# Patient Record
Sex: Male | Born: 1935 | Race: White | Hispanic: No | Smoking: Never smoker
Health system: Southern US, Community
[De-identification: ages and names within clinical notes are randomized; demographics above are authoritative.]

## PROBLEM LIST (undated history)

## (undated) DIAGNOSIS — R627 Adult failure to thrive: Secondary | ICD-10-CM

## (undated) DIAGNOSIS — J439 Emphysema, unspecified: Secondary | ICD-10-CM

## (undated) DIAGNOSIS — G4700 Insomnia, unspecified: Secondary | ICD-10-CM

## (undated) DIAGNOSIS — F0390 Unspecified dementia without behavioral disturbance: Secondary | ICD-10-CM

## (undated) DIAGNOSIS — R1310 Dysphagia, unspecified: Secondary | ICD-10-CM

## (undated) DIAGNOSIS — F329 Major depressive disorder, single episode, unspecified: Secondary | ICD-10-CM

## (undated) HISTORY — PX: OTHER SURGICAL HISTORY: SHX169

---

## 2014-01-04 ENCOUNTER — Emergency Department: Payer: Self-pay | Admitting: Internal Medicine

## 2014-01-04 LAB — URINALYSIS, COMPLETE
Bacteria: NONE SEEN
Bilirubin,UR: NEGATIVE
Blood: NEGATIVE
Glucose,UR: NEGATIVE mg/dL (ref 0–75)
KETONE: NEGATIVE
Leukocyte Esterase: NEGATIVE
NITRITE: NEGATIVE
PH: 6 (ref 4.5–8.0)
Protein: NEGATIVE
Specific Gravity: 1.017 (ref 1.003–1.030)
Squamous Epithelial: <1

## 2014-03-25 ENCOUNTER — Emergency Department: Payer: Self-pay | Admitting: Emergency Medicine

## 2014-10-05 ENCOUNTER — Emergency Department
Admission: EM | Admit: 2014-10-05 | Discharge: 2014-10-05 | Disposition: A | Discharge status: Home / Self Care | Payer: Medicare Other | Attending: Emergency Medicine | Admitting: Emergency Medicine

## 2014-10-05 ENCOUNTER — Emergency Department: Payer: Medicare Other

## 2014-10-05 ENCOUNTER — Encounter: Payer: Self-pay | Admitting: Emergency Medicine

## 2014-10-05 DIAGNOSIS — S0191XA Laceration without foreign body of unspecified part of head, initial encounter: Secondary | ICD-10-CM

## 2014-10-05 DIAGNOSIS — X58XXXA Exposure to other specified factors, initial encounter: Secondary | ICD-10-CM | POA: Diagnosis not present

## 2014-10-05 DIAGNOSIS — S0181XA Laceration without foreign body of other part of head, initial encounter: Secondary | ICD-10-CM | POA: Diagnosis not present

## 2014-10-05 DIAGNOSIS — F0390 Unspecified dementia without behavioral disturbance: Secondary | ICD-10-CM | POA: Insufficient documentation

## 2014-10-05 DIAGNOSIS — Y92129 Unspecified place in nursing home as the place of occurrence of the external cause: Secondary | ICD-10-CM | POA: Insufficient documentation

## 2014-10-05 DIAGNOSIS — Z79899 Other long term (current) drug therapy: Secondary | ICD-10-CM | POA: Insufficient documentation

## 2014-10-05 DIAGNOSIS — Y9389 Activity, other specified: Secondary | ICD-10-CM | POA: Insufficient documentation

## 2014-10-05 DIAGNOSIS — Y998 Other external cause status: Secondary | ICD-10-CM | POA: Diagnosis not present

## 2014-10-05 HISTORY — DX: Dysphagia, unspecified: R13.10

## 2014-10-05 HISTORY — DX: Unspecified dementia, unspecified severity, without behavioral disturbance, psychotic disturbance, mood disturbance, and anxiety: F03.90

## 2014-10-05 HISTORY — DX: Major depressive disorder, single episode, unspecified: F32.9

## 2014-10-05 HISTORY — DX: Adult failure to thrive: R62.7

## 2014-10-05 HISTORY — DX: Emphysema, unspecified: J43.9

## 2014-10-05 HISTORY — DX: Insomnia, unspecified: G47.00

## 2014-10-05 NOTE — Discharge Instructions (Signed)
The laceration Mr. Broome's headache is a bit complex. It required sutures on the front part (3 of them, 6-0 Prolene) and Steri-Strips on the back part due to the skin tear nature of the injury.  Mr. Steck's head CT was negative. He was alert and communicative without any focal neurologic defect.   The stitches can be removed in 7-10 days. The Steri-Strips be removed at that time also. He will need to see a doctor to further evaluate the skin tear portion, in case any skin needs to be debrided.  Follow-up with his regular doctor for this. Return to the emergency department if there are urgent concerns.  Sutured Wound Care Sutures are stitches that can be used to close wounds. Caring for your wound can help stop infection and lessen pain. HOME CARE   Rest and raise (elevate) the injured area until the pain and puffiness (swelling) go away.  Only take medicines as told by your doctor.  Clean the wound gently with mild soap and water once a day after the first 2 days. Rinse off the soap. Pat the area dry with a clean towel. Do not rub the wound.  Change the bandage (dressing) as told by your doctor. If the bandage sticks, soak it off with soapy water. Stop using a bandage after 2 days or after the wound stops leaking fluid.  Put cream on the wound as told by your doctor.  Do not stretch the wound.  Drink enough fluids to keep your pee (urine) clear or pale yellow.  See your doctor to have the sutures removed.  Use sunscreen or sunblock on the wound after it heals. GET HELP RIGHT AWAY IF:   Your wound gets red, puffy, hot, or tender.  You have more pain in the wound.  You have a red streak that goes away from the wound.  You see yellowish-white fluid (pus) coming out of the wound.  You have a fever.  You have chills and start to shake.  You notice a bad smell coming from the wound.  Your wound will not stop bleeding. MAKE SURE YOU:   Understand these  instructions.  Will watch your condition.  Will get help right away if you are not doing well or get worse. Document Released: 06/17/2007 Document Revised: 03/23/2011 Document Reviewed: 05/04/2010 [facility] Patient Information 2015 [place], [place]. This information is not intended to replace advice given to you by your health care provider. Make sure you discuss any questions you have with your health care provider.

## 2014-10-05 NOTE — ED Notes (Signed)
Verbal report given to EMS prior to transport back to [facility].

## 2014-10-05 NOTE — ED Notes (Signed)
Telephone report called to Jerrol Banana, LPN

## 2014-10-05 NOTE — ED Notes (Signed)
Pt arrives via EMS from [facility]. Pt was found by staff lying in bed with laceration to left side of forehead and blood in the floor. Pt with possible unwitnessed fall. Pt denies pain. Pt AAOx4. NAD noted. RR even and nonlabored. Laceration noted to left side of forehead. Per EMS, staff states pt usually walks and communicates.

## 2014-10-05 NOTE — ED Provider Notes (Signed)
[facility] Emergency Department Provider Note  ____________________________________________  Time seen: 7:09 AM  I have reviewed the triage vital signs and the nursing notes.   HISTORY  Chief Complaint Fall     HPI James Mcguire is a 81 y.o. male who presents to emergency department with a laceration to his upper left forehead., From wide-open manner. He is alert and communicative with me, though he does not recall the injury or the fall he likely had.  He denies any pain or injury elsewhere. He is cooperative with exam although a little annoyed by it. He denies weakness, chest pain, shortness of breath, or headache.     Past Medical History  Diagnosis Date  . Dementia   . Dysphagia   . Insomnia   . Failure to thrive in adult   . Emphysema of lung   . Major depressive disorder   . Dementia     There are no active problems to display for this patient.   History reviewed. No pertinent past surgical history.  Current Outpatient Rx  Name  Route  Sig  Dispense  Refill  . acetaminophen (TYLENOL) 500 MG tablet   Oral   Take 500 mg by mouth every 4 (four) hours as needed for mild pain, moderate pain or fever.         Marland Kitchen albuterol-ipratropium (COMBIVENT) 18-103 MCG/ACT inhaler   Inhalation   Inhale 1 puff into the lungs 3 (three) times daily.         . clonazepam (KLONOPIN) 0.125 MG disintegrating tablet   Oral   Take 0.125 mg by mouth 2 (two) times daily.         . clonazepam (KLONOPIN) 0.125 MG disintegrating tablet   Oral   Take 0.125 mg by mouth daily as needed (anxiety).         Marland Kitchen ipratropium-albuterol (DUONEB) 0.5-2.5 (3) MG/3ML SOLN   Nebulization   Take 3 mLs by nebulization 4 (four) times daily as needed (Shortness of Breath/ Wheezing).         . Menthol-Zinc Oxide (CALMOSEPTINE) 0.44-20.6 % OINT   Apply externally   Apply 1 application topically as needed (moisture barrier). Apply with Vitamin A&D ointment          . mirtazapine (REMERON) 15 MG tablet   Oral   Take 7.5 mg by mouth at bedtime.         . polyethylene glycol (MIRALAX / GLYCOLAX) packet   Oral   Take 17 g by mouth daily.         Marland Kitchen senna-docusate (SENOKOT-S) 8.6-50 MG per tablet   Oral   Take 2 tablets by mouth 2 (two) times daily.         . traZODone (DESYREL) 50 MG tablet   Oral   Take 50 mg by mouth at bedtime as needed for sleep.         . Vitamins A & D (VITAMIN A & D) ointment   Topical   Apply 1 application topically as needed (moisture barrier). Apply with Calmoseptine           Allergies Review of patient's allergies indicates no known allergies.  No family history on file.  Social History Social History  Substance Use Topics  . Smoking status: Never Smoker   . Smokeless tobacco: None  . Alcohol Use: No    Review of Systems  Constitutional: Negative for fever. ENT: Negative for sore throat. Cardiovascular: Negative for chest pain. Respiratory: Negative for  shortness of breath. Gastrointestinal: Negative for abdominal pain, vomiting and diarrhea. Genitourinary: Negative for dysuria. Musculoskeletal: No myalgias or injuries. Skin: Notable for skin laceration, left upper forehead.   10-point ROS otherwise negative.  ____________________________________________   PHYSICAL EXAM:  VITAL SIGNS: ED Triage Vitals  Enc Vitals Group     BP 10/05/14 0635 149/82 mmHg     Pulse Rate 10/05/14 0635 68     Resp 10/05/14 0635 18     Temp 10/05/14 0635 97.6 F (36.4 C)     Temp Source 10/05/14 0635 Oral     SpO2 10/05/14 0635 96 %     Weight 10/05/14 0635 130 lb (58.968 kg)     Height 10/05/14 0635  (1.753 m)     Head Cir --      Peak Flow --      Pain Score 10/05/14 0636 0     Pain Loc --      Pain Edu? --      Excl. in GC? --     Constitutional: Alert communicative. No distress. ENT   Head: Laceration to left upper forehead area and this laceration appears rather irregular and  is approximately 3 cm long.   Nose: No congestion/rhinnorhea.   Mouth/Throat: Mucous membranes are moist. Cardiovascular: Normal rate, regular rhythm, no murmur noted Respiratory:  Normal respiratory effort, no tachypnea.    Breath sounds are clear and equal bilaterally.  Gastrointestinal: Soft and nontender. No distention.  Back: No muscle spasm, no tenderness, no CVA tenderness. Musculoskeletal: No deformity noted. Nontender with normal range of motion in all extremities.  No noted edema. Pelvis is stable, hips are nontender. Neurologic: Alert, communicative. Moves all 4 extremities without difficulty. Appears appropriate.. No gross focal neurologic deficits are appreciated.  Skin:  Laceration to left upper forehead. This laceration is somewhat irregular with tissue being a little bit confused. The anterior portion is a straightforward laceration, but involves into a skin tear further back along the track. It's approximately 4 cm long.  ____________________________________________    LABS (pertinent positives/negatives)    ____________________________________________   EKG  ED ECG REPORT I, Arianni Gallego W, the attending physician, personally viewed and interpreted this ECG.   Date: 10/05/2014  EKG Time: 6:33 AM   Rate: 71  Rhythm: Normal sinus rhythm  Axis: Normal  Intervals: Normal  ST&T Change: None noted   ____________________________________________    RADIOLOGY  CT head  IMPRESSION: 1. No acute intracranial abnormalities. 2. Mild atrophy and chronic microvascular ischemic change.   ____________________________________________   PROCEDURES  LACERATION REPAIR Performed by: Darien Ramus Authorized by: Darien Ramus Consent: Verbal consent obtained. Risks and benefits: risks, benefits and alternatives were discussed Consent given by: patient Patient identity confirmed: provided demographic data Prepped and Draped in normal sterile  fashion Wound explored  Laceration Location: Left upper for head  Laceration Length: 4 cm  No Foreign Bodies seen or palpated  Irrigation method: syringe Amount of cleaning: standard  Skin closure: Complex closure involving both 6-0 Prolene suture and Steri-Strips   Number of sutures: 3 stitches, simple, on the anterior portion and 3 Steri-Strips on the posterior skin tear section   Technique: Combination of simple sutures and Steri-Strip placement due to the laceration, skin tear, combination.   Patient tolerance: Patient tolerated the procedure well with no immediate complications.  ____________________________________________   INITIAL IMPRESSION / ASSESSMENT AND PLAN / ED COURSE  Pertinent labs & imaging results that were available during my care of the patient were  reviewed by me and considered in my medical decision making (see chart for details).  Pleasant alert 81 year old male in no acute distress. He denies any pain or injury. He is aware of the cut on his head. He does not recall how this occurred.  I have closed this somewhat complicated laceration/skin tear. While he is neurologically intact and without complaint, given his advanced age, lack of memory for the event, and injury to the head, we will perform a CT scan on his head.  ____________________________________________   FINAL CLINICAL IMPRESSION(S) / ED DIAGNOSES  Final diagnoses:  Laceration of head, initial encounter  head contusion    Darien Ramus, MD 10/05/14 [phone]

## 2014-10-05 NOTE — ED Notes (Signed)
Vanita Panda (son-in-law) called and informed of visit to ER and updated on laceration to head and results from CT scan.  Vanita Panda asked to come and pick patient up and take back to [facility] as patient is being discharged.  Vanita Panda refused and asked that he be taken back by EMS.  Dr. Carollee Massed informed.

## 2014-10-07 MED ORDER — GI COCKTAIL ~~LOC~~
ORAL | Status: AC
  Filled 2014-10-07: qty 30

## 2014-10-10 MED ORDER — LEVOFLOXACIN 750 MG PO TABS
ORAL_TABLET | ORAL | Status: AC
  Filled 2014-10-10: qty 1

## 2014-10-11 ENCOUNTER — Other Ambulatory Visit
Admission: RE | Admit: 2014-10-11 | Discharge: 2014-10-11 | Disposition: A | Discharge status: Home / Self Care | Payer: Medicare Other | Source: Other Acute Inpatient Hospital | Attending: Family Medicine | Admitting: Family Medicine

## 2014-10-11 ENCOUNTER — Other Ambulatory Visit: Admission: RE | Admit: 2014-10-11 | Payer: Medicare Other

## 2014-10-11 DIAGNOSIS — R509 Fever, unspecified: Secondary | ICD-10-CM | POA: Diagnosis present

## 2014-10-11 DIAGNOSIS — R0981 Nasal congestion: Secondary | ICD-10-CM | POA: Diagnosis present

## 2014-10-11 LAB — CBC WITH DIFFERENTIAL/PLATELET
Basophils Absolute: 0.1 10*3/uL (ref 0–0.1)
Basophils Relative: 1 %
EOS PCT: 7 %
Eosinophils Absolute: 0.8 10*3/uL — ABNORMAL HIGH (ref 0–0.7)
HEMATOCRIT: 33.2 % — AB (ref 40.0–52.0)
HEMOGLOBIN: 11.2 g/dL — AB (ref 13.0–18.0)
LYMPHS ABS: 1.5 10*3/uL (ref 1.0–3.6)
LYMPHS PCT: 13 %
MCH: 31.6 pg (ref 26.0–34.0)
MCHC: 33.6 g/dL (ref 32.0–36.0)
MCV: 94.1 fL (ref 80.0–100.0)
Monocytes Absolute: 1 10*3/uL (ref 0.2–1.0)
Monocytes Relative: 9 %
NEUTROS ABS: 7.9 10*3/uL — AB (ref 1.4–6.5)
NEUTROS PCT: 70 %
Platelets: 363 10*3/uL (ref 150–440)
RBC: 3.53 MIL/uL — AB (ref 4.40–5.90)
RDW: 13.5 % (ref 11.5–14.5)
WBC: 11.2 10*3/uL — AB (ref 3.8–10.6)

## 2014-10-11 LAB — ELECTROLYTE PANEL
ANION GAP: 7 (ref 5–15)
CO2: 27 mmol/L (ref 22–32)
Chloride: 102 mmol/L (ref 101–111)
Potassium: 3.9 mmol/L (ref 3.5–5.1)
Sodium: 136 mmol/L (ref 135–145)

## 2015-02-27 ENCOUNTER — Inpatient Hospital Stay: Payer: Medicare Other

## 2015-02-27 ENCOUNTER — Inpatient Hospital Stay: Payer: Medicare Other | Admitting: Anesthesiology

## 2015-02-27 ENCOUNTER — Emergency Department: Payer: Medicare Other

## 2015-02-27 ENCOUNTER — Encounter
Admission: EM | Disposition: A | Discharge status: Skilled Nursing Facility | Payer: Self-pay | Source: Home / Self Care | Attending: Internal Medicine

## 2015-02-27 ENCOUNTER — Inpatient Hospital Stay
Admission: EM | Admit: 2015-02-27 | Discharge: 2015-03-03 | DRG: 469 | Disposition: A | Discharge status: Skilled Nursing Facility | Payer: Medicare Other | Attending: Internal Medicine | Admitting: Internal Medicine

## 2015-02-27 DIAGNOSIS — E43 Unspecified severe protein-calorie malnutrition: Secondary | ICD-10-CM | POA: Diagnosis present

## 2015-02-27 DIAGNOSIS — F329 Major depressive disorder, single episode, unspecified: Secondary | ICD-10-CM | POA: Diagnosis present

## 2015-02-27 DIAGNOSIS — N390 Urinary tract infection, site not specified: Secondary | ICD-10-CM | POA: Diagnosis not present

## 2015-02-27 DIAGNOSIS — R509 Fever, unspecified: Secondary | ICD-10-CM

## 2015-02-27 DIAGNOSIS — Z9289 Personal history of other medical treatment: Secondary | ICD-10-CM

## 2015-02-27 DIAGNOSIS — M4856XA Collapsed vertebra, not elsewhere classified, lumbar region, initial encounter for fracture: Secondary | ICD-10-CM | POA: Diagnosis present

## 2015-02-27 DIAGNOSIS — R627 Adult failure to thrive: Secondary | ICD-10-CM

## 2015-02-27 DIAGNOSIS — I959 Hypotension, unspecified: Secondary | ICD-10-CM

## 2015-02-27 DIAGNOSIS — S72002A Fracture of unspecified part of neck of left femur, initial encounter for closed fracture: Principal | ICD-10-CM | POA: Diagnosis present

## 2015-02-27 DIAGNOSIS — J449 Chronic obstructive pulmonary disease, unspecified: Secondary | ICD-10-CM | POA: Diagnosis present

## 2015-02-27 DIAGNOSIS — Z419 Encounter for procedure for purposes other than remedying health state, unspecified: Secondary | ICD-10-CM

## 2015-02-27 DIAGNOSIS — D62 Acute posthemorrhagic anemia: Secondary | ICD-10-CM | POA: Diagnosis not present

## 2015-02-27 DIAGNOSIS — E559 Vitamin D deficiency, unspecified: Secondary | ICD-10-CM | POA: Diagnosis present

## 2015-02-27 DIAGNOSIS — R1310 Dysphagia, unspecified: Secondary | ICD-10-CM | POA: Diagnosis present

## 2015-02-27 DIAGNOSIS — Y92129 Unspecified place in nursing home as the place of occurrence of the external cause: Secondary | ICD-10-CM

## 2015-02-27 DIAGNOSIS — Z87891 Personal history of nicotine dependence: Secondary | ICD-10-CM | POA: Diagnosis not present

## 2015-02-27 DIAGNOSIS — Z79899 Other long term (current) drug therapy: Secondary | ICD-10-CM | POA: Diagnosis not present

## 2015-02-27 DIAGNOSIS — Z6821 Body mass index (BMI) 21.0-21.9, adult: Secondary | ICD-10-CM

## 2015-02-27 DIAGNOSIS — T148 Other injury of unspecified body region: Secondary | ICD-10-CM | POA: Diagnosis present

## 2015-02-27 DIAGNOSIS — D638 Anemia in other chronic diseases classified elsewhere: Secondary | ICD-10-CM | POA: Diagnosis present

## 2015-02-27 DIAGNOSIS — IMO0002 Reserved for concepts with insufficient information to code with codable children: Secondary | ICD-10-CM

## 2015-02-27 DIAGNOSIS — F0390 Unspecified dementia without behavioral disturbance: Secondary | ICD-10-CM | POA: Diagnosis present

## 2015-02-27 DIAGNOSIS — Z66 Do not resuscitate: Secondary | ICD-10-CM | POA: Diagnosis present

## 2015-02-27 DIAGNOSIS — W1839XA Other fall on same level, initial encounter: Secondary | ICD-10-CM | POA: Diagnosis present

## 2015-02-27 DIAGNOSIS — G8918 Other acute postprocedural pain: Secondary | ICD-10-CM

## 2015-02-27 DIAGNOSIS — E860 Dehydration: Secondary | ICD-10-CM | POA: Diagnosis present

## 2015-02-27 DIAGNOSIS — S32010A Wedge compression fracture of first lumbar vertebra, initial encounter for closed fracture: Secondary | ICD-10-CM

## 2015-02-27 HISTORY — PX: TOTAL HIP ARTHROPLASTY: SHX124

## 2015-02-27 LAB — COMPREHENSIVE METABOLIC PANEL
ALBUMIN: 4 g/dL (ref 3.5–5.0)
ALT: 17 U/L (ref 17–63)
ANION GAP: 8 (ref 5–15)
AST: 25 U/L (ref 15–41)
Alkaline Phosphatase: 60 U/L (ref 38–126)
BUN: 34 mg/dL — ABNORMAL HIGH (ref 6–20)
CHLORIDE: 104 mmol/L (ref 101–111)
CO2: 23 mmol/L (ref 22–32)
Calcium: 8.7 mg/dL — ABNORMAL LOW (ref 8.9–10.3)
Creatinine, Ser: 1.09 mg/dL (ref 0.61–1.24)
GFR calc non Af Amer: >60 mL/min (ref >60)
Glucose, Bld: 110 mg/dL — ABNORMAL HIGH (ref 65–99)
Potassium: 3.8 mmol/L (ref 3.5–5.1)
SODIUM: 135 mmol/L (ref 135–145)
Total Bilirubin: 0.7 mg/dL (ref 0.3–1.2)
Total Protein: 6.8 g/dL (ref 6.5–8.1)

## 2015-02-27 LAB — URINALYSIS COMPLETE WITH MICROSCOPIC (ARMC ONLY)
Bacteria, UA: NONE SEEN
Bilirubin Urine: NEGATIVE
Glucose, UA: NEGATIVE mg/dL
Hgb urine dipstick: NEGATIVE
Leukocytes, UA: NEGATIVE
Nitrite: NEGATIVE
Protein, ur: NEGATIVE mg/dL
Specific Gravity, Urine: 1.018 (ref 1.005–1.030)
pH: 6 (ref 5.0–8.0)

## 2015-02-27 LAB — CBC
HCT: 29.3 % — ABNORMAL LOW (ref 40.0–52.0)
Hemoglobin: 9.7 g/dL — ABNORMAL LOW (ref 13.0–18.0)
MCH: 30.6 pg (ref 26.0–34.0)
MCHC: 33.1 g/dL (ref 32.0–36.0)
MCV: 92.5 fL (ref 80.0–100.0)
PLATELETS: 308 10*3/uL (ref 150–440)
RBC: 3.17 MIL/uL — AB (ref 4.40–5.90)
RDW: 13.3 % (ref 11.5–14.5)
WBC: 12.7 10*3/uL — AB (ref 3.8–10.6)

## 2015-02-27 LAB — ABO/RH: ABO/RH(D): A POS

## 2015-02-27 LAB — HEMOGLOBIN A1C: Hgb A1c MFr Bld: 5.6 % (ref 4.0–6.0)

## 2015-02-27 LAB — MRSA PCR SCREENING: MRSA by PCR: NEGATIVE

## 2015-02-27 LAB — TROPONIN I: Troponin I: 0.04 ng/mL — ABNORMAL HIGH (ref <0.031)

## 2015-02-27 LAB — TSH: TSH: 3.91 u[IU]/mL (ref 0.350–4.500)

## 2015-02-27 SURGERY — ARTHROPLASTY, HIP, TOTAL, ANTERIOR APPROACH
Anesthesia: General | Site: Hip | Laterality: Left | Wound class: Clean

## 2015-02-27 MED ORDER — TIOTROPIUM BROMIDE MONOHYDRATE 18 MCG IN CAPS
18.0000 ug | ORAL_CAPSULE | Freq: Every day | RESPIRATORY_TRACT | Status: DC
  Administered 2015-03-01 – 2015-03-03 (×3): 18 ug via RESPIRATORY_TRACT
  Filled 2015-02-27: qty 5

## 2015-02-27 MED ORDER — VITAMINS A & D EX OINT: 1.0000 "application " | TOPICAL_OINTMENT | CUTANEOUS | Status: DC | PRN

## 2015-02-27 MED ORDER — ENOXAPARIN SODIUM 40 MG/0.4ML ~~LOC~~ SOLN
40.0000 mg | SUBCUTANEOUS | Status: DC
  Administered 2015-02-28 – 2015-03-03 (×4): 40 mg via SUBCUTANEOUS
  Filled 2015-02-27 (×4): qty 0.4

## 2015-02-27 MED ORDER — IPRATROPIUM-ALBUTEROL 0.5-2.5 (3) MG/3ML IN SOLN
3.0000 mL | Freq: Four times a day (QID) | RESPIRATORY_TRACT | Status: DC | PRN
  Filled 2015-02-27: qty 3

## 2015-02-27 MED ORDER — TIOTROPIUM BROMIDE MONOHYDRATE 18 MCG IN CAPS
18.0000 ug | ORAL_CAPSULE | Freq: Every day | RESPIRATORY_TRACT | Status: DC
  Administered 2015-02-28: 18 ug via RESPIRATORY_TRACT
  Filled 2015-02-27: qty 5

## 2015-02-27 MED ORDER — PROPOFOL 10 MG/ML IV BOLUS
INTRAVENOUS | Status: DC | PRN
  Administered 2015-02-27: 80 mg via INTRAVENOUS

## 2015-02-27 MED ORDER — ACETAMINOPHEN 500 MG PO TABS
1000.0000 mg | ORAL_TABLET | Freq: Four times a day (QID) | ORAL | Status: AC
  Administered 2015-02-27 – 2015-02-28 (×2): 1000 mg via ORAL
  Filled 2015-02-27: qty 2

## 2015-02-27 MED ORDER — DIPHENHYDRAMINE HCL 12.5 MG/5ML PO ELIX: 12.5000 mg | ORAL_SOLUTION | ORAL | Status: DC | PRN

## 2015-02-27 MED ORDER — ONDANSETRON HCL 4 MG/2ML IJ SOLN
INTRAMUSCULAR | Status: AC
  Administered 2015-02-27: 4 mg via INTRAVENOUS
  Filled 2015-02-27: qty 2

## 2015-02-27 MED ORDER — CLONAZEPAM 0.125 MG PO TBDP: 0.1250 mg | ORAL_TABLET | Freq: Every day | ORAL | Status: DC | PRN

## 2015-02-27 MED ORDER — CEFAZOLIN SODIUM 1-5 GM-% IV SOLN
1.0000 g | Freq: Four times a day (QID) | INTRAVENOUS | Status: AC
  Administered 2015-02-27 – 2015-02-28 (×3): 1 g via INTRAVENOUS
  Filled 2015-02-27 (×3): qty 50

## 2015-02-27 MED ORDER — ONDANSETRON HCL 4 MG/2ML IJ SOLN: 4.0000 mg | Freq: Once | INTRAMUSCULAR | Status: DC | PRN

## 2015-02-27 MED ORDER — BUPIVACAINE-EPINEPHRINE 0.25% -1:200000 IJ SOLN
INTRAMUSCULAR | Status: DC | PRN
  Administered 2015-02-27: 30 mL

## 2015-02-27 MED ORDER — FENTANYL CITRATE (PF) 100 MCG/2ML IJ SOLN: 25.0000 ug | INTRAMUSCULAR | Status: DC | PRN

## 2015-02-27 MED ORDER — NEOMYCIN-POLYMYXIN B GU 40-200000 IR SOLN
Status: DC | PRN
  Administered 2015-02-27: 4 mL

## 2015-02-27 MED ORDER — IPRATROPIUM-ALBUTEROL 18-103 MCG/ACT IN AERO: 1.0000 | INHALATION_SPRAY | Freq: Three times a day (TID) | RESPIRATORY_TRACT | Status: DC

## 2015-02-27 MED ORDER — MIRTAZAPINE 15 MG PO TABS
7.5000 mg | ORAL_TABLET | Freq: Every day | ORAL | Status: DC
  Administered 2015-02-28 – 2015-03-02 (×3): 7.5 mg via ORAL
  Filled 2015-02-27: qty 2
  Filled 2015-02-27 (×2): qty 1

## 2015-02-27 MED ORDER — OXYCODONE HCL 5 MG PO TABS
5.0000 mg | ORAL_TABLET | ORAL | Status: DC | PRN
  Administered 2015-02-27 – 2015-03-03 (×4): 5 mg via ORAL
  Filled 2015-02-27: qty 1
  Filled 2015-02-27: qty 2
  Filled 2015-02-27 (×2): qty 1

## 2015-02-27 MED ORDER — ACETAMINOPHEN 325 MG PO TABS
650.0000 mg | ORAL_TABLET | Freq: Four times a day (QID) | ORAL | Status: DC | PRN
  Administered 2015-02-27 – 2015-03-02 (×4): 650 mg via ORAL
  Filled 2015-02-27 (×6): qty 2

## 2015-02-27 MED ORDER — ACETAMINOPHEN 10 MG/ML IV SOLN
INTRAVENOUS | Status: DC | PRN
  Administered 2015-02-27: 1000 mg via INTRAVENOUS

## 2015-02-27 MED ORDER — MORPHINE SULFATE (PF) 2 MG/ML IV SOLN
2.0000 mg | Freq: Once | INTRAVENOUS | Status: AC
  Administered 2015-02-27: 2 mg via INTRAVENOUS

## 2015-02-27 MED ORDER — IPRATROPIUM-ALBUTEROL 0.5-2.5 (3) MG/3ML IN SOLN
3.0000 mL | Freq: Four times a day (QID) | RESPIRATORY_TRACT | Status: DC
  Administered 2015-02-27 (×2): 3 mL via RESPIRATORY_TRACT
  Filled 2015-02-27 (×2): qty 3

## 2015-02-27 MED ORDER — LIDOCAINE HCL (CARDIAC) 20 MG/ML IV SOLN
INTRAVENOUS | Status: DC | PRN
  Administered 2015-02-27: 100 mg via INTRAVENOUS

## 2015-02-27 MED ORDER — ONDANSETRON HCL 4 MG/2ML IJ SOLN: 4.0000 mg | Freq: Four times a day (QID) | INTRAMUSCULAR | Status: DC | PRN

## 2015-02-27 MED ORDER — ACETAMINOPHEN 650 MG RE SUPP: 650.0000 mg | Freq: Four times a day (QID) | RECTAL | Status: DC | PRN

## 2015-02-27 MED ORDER — TRAZODONE HCL 50 MG PO TABS
50.0000 mg | ORAL_TABLET | Freq: Every evening | ORAL | Status: DC | PRN
  Administered 2015-02-27 – 2015-03-02 (×4): 50 mg via ORAL
  Filled 2015-02-27 (×4): qty 1

## 2015-02-27 MED ORDER — ONDANSETRON HCL 4 MG/2ML IJ SOLN
4.0000 mg | Freq: Once | INTRAMUSCULAR | Status: AC
  Administered 2015-02-27: 4 mg via INTRAVENOUS

## 2015-02-27 MED ORDER — MENTHOL 3 MG MT LOZG: 1.0000 | LOZENGE | OROMUCOSAL | Status: DC | PRN

## 2015-02-27 MED ORDER — SENNOSIDES-DOCUSATE SODIUM 8.6-50 MG PO TABS
2.0000 | ORAL_TABLET | Freq: Two times a day (BID) | ORAL | Status: DC
  Administered 2015-02-28 – 2015-03-03 (×6): 2 via ORAL
  Filled 2015-02-27 (×7): qty 2

## 2015-02-27 MED ORDER — PHENOL 1.4 % MT LIQD: 1.0000 | OROMUCOSAL | Status: DC | PRN

## 2015-02-27 MED ORDER — SODIUM CHLORIDE 0.9 % IV SOLN
INTRAVENOUS | Status: DC
  Administered 2015-02-27 – 2015-03-02 (×4): via INTRAVENOUS

## 2015-02-27 MED ORDER — FENTANYL CITRATE (PF) 100 MCG/2ML IJ SOLN
INTRAMUSCULAR | Status: DC | PRN
  Administered 2015-02-27: 100 ug via INTRAVENOUS
  Administered 2015-02-27: 50 ug via INTRAVENOUS

## 2015-02-27 MED ORDER — SODIUM CHLORIDE 0.9 % IV SOLN
INTRAVENOUS | Status: DC
  Administered 2015-02-27 – 2015-03-01 (×3): via INTRAVENOUS

## 2015-02-27 MED ORDER — MAGNESIUM HYDROXIDE 400 MG/5ML PO SUSP: 30.0000 mL | Freq: Every day | ORAL | Status: DC | PRN

## 2015-02-27 MED ORDER — CEFAZOLIN SODIUM 1-5 GM-% IV SOLN
1.0000 g | Freq: Once | INTRAVENOUS | Status: DC
  Filled 2015-02-27 (×2): qty 50

## 2015-02-27 MED ORDER — LACTATED RINGERS IV SOLN
INTRAVENOUS | Status: DC | PRN
  Administered 2015-02-27: 17:00:00 via INTRAVENOUS

## 2015-02-27 MED ORDER — SODIUM CHLORIDE 0.9% FLUSH
3.0000 mL | Freq: Two times a day (BID) | INTRAVENOUS | Status: DC
  Administered 2015-02-28 – 2015-03-02 (×4): 3 mL via INTRAVENOUS

## 2015-02-27 MED ORDER — POLYETHYLENE GLYCOL 3350 17 G PO PACK
17.0000 g | PACK | Freq: Every day | ORAL | Status: DC
  Administered 2015-02-28 – 2015-03-03 (×3): 17 g via ORAL
  Filled 2015-02-27 (×4): qty 1

## 2015-02-27 MED ORDER — MORPHINE SULFATE (PF) 2 MG/ML IV SOLN
INTRAVENOUS | Status: AC
  Administered 2015-02-27: 2 mg via INTRAVENOUS
  Filled 2015-02-27: qty 1

## 2015-02-27 MED ORDER — ZINC OXIDE 20 % EX OINT: 1.0000 "application " | TOPICAL_OINTMENT | CUTANEOUS | Status: DC | PRN

## 2015-02-27 MED ORDER — SODIUM CHLORIDE 0.9 % IV SOLN
INTRAVENOUS | Status: DC
  Administered 2015-02-27: 05:00:00 via INTRAVENOUS

## 2015-02-27 MED ORDER — SUCCINYLCHOLINE CHLORIDE 20 MG/ML IJ SOLN
INTRAMUSCULAR | Status: DC | PRN
  Administered 2015-02-27: 50 mg via INTRAVENOUS
  Administered 2015-02-27: 100 mg via INTRAVENOUS

## 2015-02-27 MED ORDER — ONDANSETRON HCL 4 MG PO TABS: 4.0000 mg | ORAL_TABLET | Freq: Four times a day (QID) | ORAL | Status: DC | PRN

## 2015-02-27 MED ORDER — BISACODYL 10 MG RE SUPP
10.0000 mg | Freq: Every day | RECTAL | Status: DC | PRN
  Administered 2015-03-01: 10 mg via RECTAL
  Filled 2015-02-27: qty 1

## 2015-02-27 MED ORDER — CLONAZEPAM 0.125 MG PO TBDP
0.1250 mg | ORAL_TABLET | Freq: Two times a day (BID) | ORAL | Status: DC
  Administered 2015-02-28 – 2015-03-03 (×6): 0.125 mg via ORAL
  Filled 2015-02-27 (×6): qty 1

## 2015-02-27 MED ORDER — MORPHINE SULFATE (PF) 2 MG/ML IV SOLN
2.0000 mg | INTRAVENOUS | Status: DC | PRN
  Administered 2015-02-27: 2 mg via INTRAVENOUS
  Filled 2015-02-27: qty 1

## 2015-02-27 MED ORDER — SUGAMMADEX SODIUM 200 MG/2ML IV SOLN
INTRAVENOUS | Status: DC | PRN
  Administered 2015-02-27: 121.2 mg via INTRAVENOUS

## 2015-02-27 MED ORDER — MAGNESIUM CITRATE PO SOLN: 1.0000 | Freq: Once | ORAL | Status: DC | PRN

## 2015-02-27 MED ORDER — ALUM & MAG HYDROXIDE-SIMETH 200-200-20 MG/5ML PO SUSP: 30.0000 mL | ORAL | Status: DC | PRN

## 2015-02-27 MED ORDER — MORPHINE SULFATE (PF) 2 MG/ML IV SOLN
2.0000 mg | INTRAVENOUS | Status: DC | PRN
  Administered 2015-02-27 – 2015-02-28 (×3): 2 mg via INTRAVENOUS
  Filled 2015-02-27 (×3): qty 1

## 2015-02-27 MED ORDER — IPRATROPIUM-ALBUTEROL 0.5-2.5 (3) MG/3ML IN SOLN
3.0000 mL | Freq: Three times a day (TID) | RESPIRATORY_TRACT | Status: DC
  Administered 2015-02-27 – 2015-03-01 (×6): 3 mL via RESPIRATORY_TRACT
  Filled 2015-02-27 (×5): qty 3

## 2015-02-27 SURGICAL SUPPLY — 46 items

## 2015-02-27 NOTE — ED Provider Notes (Signed)
[facility] Emergency Department Provider Note  ____________________________________________  Time seen: 3:30 AM  I have reviewed the triage vital signs and the nursing notes.   HISTORY  Chief Complaint Fall      HPI James Mcguire is a 82 y.o. male presents via [facility] from [facility] secondary to left hip fracture site from a fall at 11 PM last night. Patient states that he lost his balance resulting in fall. Patient admits to left hip pain is currently 10 out of 10. Patient also admits to hitting his head with possible loss of consciousness. In addition patient does admit to low back pain.     Past Medical History  Diagnosis Date  . Dementia   . Dysphagia   . Insomnia   . Failure to thrive in adult   . Emphysema of lung (HCC)   . Major depressive disorder (HCC)   . Dementia     There are no active problems to display for this patient.   History reviewed. No pertinent past surgical history.  Current Outpatient Rx  Name  Route  Sig  Dispense  Refill  . acetaminophen (TYLENOL) 500 MG tablet   Oral   Take 500 mg by mouth every 4 (four) hours as needed for mild pain, moderate pain or fever.         Marland Kitchen albuterol-ipratropium (COMBIVENT) 18-103 MCG/ACT inhaler   Inhalation   Inhale 1 puff into the lungs 3 (three) times daily.         . clonazepam (KLONOPIN) 0.125 MG disintegrating tablet   Oral   Take 0.125 mg by mouth 2 (two) times daily.         . clonazepam (KLONOPIN) 0.125 MG disintegrating tablet   Oral   Take 0.125 mg by mouth daily as needed (anxiety).         Marland Kitchen ipratropium-albuterol (DUONEB) 0.5-2.5 (3) MG/3ML SOLN   Nebulization   Take 3 mLs by nebulization 4 (four) times daily as needed (Shortness of Breath/ Wheezing).         . Menthol-Zinc Oxide (CALMOSEPTINE) 0.44-20.6 % OINT   Apply externally   Apply 1 application topically as needed (moisture barrier). Apply with Vitamin A&D ointment         . mirtazapine (REMERON) 15 MG tablet   Oral   Take 7.5 mg by mouth at bedtime.         . polyethylene glycol (MIRALAX / GLYCOLAX) packet   Oral   Take 17 g by mouth daily.         Marland Kitchen senna-docusate (SENOKOT-S) 8.6-50 MG per tablet   Oral   Take 2 tablets by mouth 2 (two) times daily.         . traZODone (DESYREL) 50 MG tablet   Oral   Take 50 mg by mouth at bedtime as needed for sleep.         . Vitamins A & D (VITAMIN A & D) ointment   Topical   Apply 1 application topically as needed (moisture barrier). Apply with Calmoseptine           Allergies No known drug allergies No family history on file.  Social History Social History  Substance Use Topics  . Smoking status: Never Smoker   . Smokeless tobacco: None  . Alcohol Use: No    Review of Systems  Constitutional: Negative for fever. Eyes: Negative for visual changes. ENT: Negative for sore throat. Cardiovascular: Negative for chest pain.  Respiratory: Negative for shortness of breath. Gastrointestinal: Negative for abdominal pain, vomiting and diarrhea. Genitourinary: Negative for dysuria. Musculoskeletal: Positive for low back pain, left hip pain Skin: Negative for rash. Neurological: Negative for headaches, focal weakness or numbness.   10-point ROS otherwise negative.  ____________________________________________   PHYSICAL EXAM:  VITAL SIGNS: ED Triage Vitals  Enc Vitals Group     BP 02/27/15 0328 118/63 mmHg     Pulse Rate 02/27/15 0328 79     Resp 02/27/15 0328 16     Temp 02/27/15 0328 98.1 F (36.7 C)     Temp Source 02/27/15 0328 Oral     SpO2 02/27/15 0328 90 %     Weight 02/27/15 0328 133 lb 8 oz (60.555 kg)     Height 02/27/15 0328  (1.676 m)     Head Cir --      Peak Flow --      Pain Score 02/27/15 0329 5     Pain Loc --      Pain Edu? --      Excl. in GC? --      Constitutional: Alert and oriented. Well appearing and in no distress. Eyes: Conjunctivae are  normal. PERRL. Normal extraocular movements. ENT   Head: Normocephalic and atraumatic.   Nose: No congestion/rhinnorhea.   Mouth/Throat: Mucous membranes are moist.   Neck: No stridor. Hematological/Lymphatic/Immunilogical: No cervical lymphadenopathy. Cardiovascular: Normal rate, regular rhythm. Normal and symmetric distal pulses are present in all extremities. No murmurs, rubs, or gallops. Respiratory: Normal respiratory effort without tachypnea nor retractions. Breath sounds are clear and equal bilaterally. No wheezes/rales/rhonchi. Gastrointestinal: Soft and nontender. No distention. There is no CVA tenderness. Genitourinary: deferred Musculoskeletal: Nontender with normal range of motion in all extremities. No joint effusions.  No lower extremity tenderness nor edema. Neurologic:  Normal speech and language. No gross focal neurologic deficits are appreciated. Speech is normal.  Skin:  Skin is warm, dry and intact. No rash noted. Psychiatric: Mood and affect are normal. Speech and behavior are normal. Patient exhibits appropriate insight and judgment.  ____________________________________________    LABS (pertinent positives/negatives)  Labs Reviewed  CBC - Abnormal; Notable for the following:    WBC 12.7 (*)    RBC 3.17 (*)    Hemoglobin 9.7 (*)    HCT 29.3 (*)    All other components within normal limits  COMPREHENSIVE METABOLIC PANEL - Abnormal; Notable for the following:    Glucose, Bld 110 (*)    BUN 34 (*)    Calcium 8.7 (*)    All other components within normal limits  TROPONIN I - Abnormal; Notable for the following:    Troponin I 0.04 (*)    All other components within normal limits  URINALYSIS COMPLETEWITH MICROSCOPIC ([facility] ONLY)  TYPE AND SCREEN  ABO/RH     ____________________________________________   EKG    ____________________________________________    RADIOLOGY     DG Chest 1 View (In process) Result time: 02/27/15 04:00:54       DG HIP UNILAT WITH PELVIS 2-3 VIEWS LEFT (Final result) Result time: 02/27/15 04:25:18   Final result by Rad Results In Interface (02/27/15 04:25:18)   Narrative:   CLINICAL DATA: Hip fracture after a fall around 11 p.m. last night.  EXAM: DG HIP (WITH OR WITHOUT PELVIS) 2-3V LEFT  COMPARISON: None.  FINDINGS: Acute transverse fracture of the left femoral neck with superior subluxation of the distal fracture fragment resulting in varus angulation. No evidence of involvement  of the inter trochanteric portion of the left hip. No dislocation at the left hip joint. Mild degenerative changes in the hips. Pelvis appears intact without evidence of acute displaced fracture. SI joints and symphysis pubis are not displaced. Visualized sacrum appears intact.  IMPRESSION: Acute fracture of the left femoral neck with varus angulation.   Electronically Signed By: Burman Nieves M.D. On: 02/27/2015 04:25          DG Lumbar Spine Complete (Final result) Result time: 02/27/15 04:27:18   Final result by Rad Results In Interface (02/27/15 04:27:18)   Narrative:   CLINICAL DATA: Have fracture after a fall at 11 p.m. last night.  EXAM: LUMBAR SPINE - COMPLETE 4+ VIEW  COMPARISON: None.  FINDINGS: Compression fracture of the L1 vertebra with approximately 40% loss of height. Age is indeterminate. No retropulsion of fracture fragments. Normal alignment of the lumbar spine. Degenerative changes throughout with narrowed interspaces and endplate hypertrophic changes. Vascular calcifications. Visualize sacrum appears intact.  IMPRESSION: Compression of the L1 vertebra of indeterminate age. Normal alignment of the lumbar spine. Degenerative changes.   Electronically Signed By: Burman Nieves M.D. On: 02/27/2015 04:27          CT Head Wo Contrast (Final result) Result time: 02/27/15 16:10:96   Final result by Rad Results In Interface (02/27/15 03:58:28)    Narrative:   CLINICAL DATA: Fall with head injury. History of dementia.  EXAM: CT HEAD WITHOUT CONTRAST  TECHNIQUE: Contiguous axial images were obtained from the base of the skull through the vertex without intravenous contrast.  COMPARISON: CT head October 05, 2014  FINDINGS: The ventricles and sulci are normal for age. No intraparenchymal hemorrhage, mass effect nor midline shift. Patchy supratentorial white matter hypodensities are within normal range for patient's age and though non-specific suggest sequelae of chronic small vessel ischemic disease. No acute large vascular territory infarcts.  No abnormal extra-axial fluid collections. Basal cisterns are patent. Moderate calcific atherosclerosis of the carotid siphons.  No skull fracture. The included ocular globes and orbital contents are non-suspicious. Mild suspected enophthalmos. The mastoid aircells and included paranasal sinuses are well-aerated. Moderate RIGHT temporomandibular osteoarthrosis.  IMPRESSION: No acute intracranial process.  Stable involutional changes and moderate chronic small vessel ischemic disease.   Electronically Signed By: Awilda Metro M.D. On: 02/27/2015 03:58      INITIAL IMPRESSION / ASSESSMENT AND PLAN / ED COURSE  Pertinent labs & imaging results that were available during my care of the patient were reviewed by me and considered in my medical decision making (see chart for details).  Patient received IV morphine 4 mg and Zofran 4 mg in the emergency department for analgesia and antiemetic. X-rays revealed L1 compression fracture as well as a left femoral neck fracture. Patient discussed with Dr. Trilby Drummer and Dr. Sheryle Hail for hospital admission for further evaluation and management  ____________________________________________   FINAL CLINICAL IMPRESSION(S) / ED DIAGNOSES  Final diagnoses:  Closed left hip fracture, initial encounter [facility])  Compression  fracture  Closed wedge compression fracture of first lumbar vertebra, initial encounter [facility])      Darci Current, MD 02/27/15 2243

## 2015-02-27 NOTE — Clinical Social Work Note (Addendum)
Clinical Social Work Assessment  Patient Details  Name: James Mcguire MRN: [id number] Date of Birth: June 17, 1932  Date of referral:  02/27/15               Reason for consult:  Facility Placement, Other (Comment Required) (From [facility] )                Permission sought to share information with:  Oceanographer granted to share information::  Yes, Verbal Permission Granted  Name::      [employer]::   Skilled Nursing Facility   Relationship::     Contact Information:     Housing/Transportation Living arrangements for the past 2 months:  Skilled Building surveyor of Information:  Adult Children, Other (Comment Required) (Son in Social worker James Mcguire) ) Patient Interpreter Needed:  None Criminal Activity/Legal Involvement Pertinent to Current Situation/Hospitalization:  No - Comment as needed Significant Relationships:  Adult Children Lives with:  Facility Resident Do you feel safe going back to the place where you live?  Yes Need for family participation in patient care:  Yes (Comment)  Care giving concerns:  Patient is a long term care resident at [facility].    Social Worker assessment / plan: Visual merchandiser (CSW) received SNF consult. Patient is having surgery today for hip fracture. Per chart patient is from [facility]. CSW contacted [facility] admissions coordinator at [facility] who confirmed that patient is a long term care resident. Per James Mcguire patient has been at [facility] for 3 years and uses a walker and wheel chair for ambulation. Per James Mcguire patient's daughter James Mcguire and son in law James Mcguire are primary contacts. James Mcguire reported that patient's wife has passed away. Per James Mcguire patient can return to [facility] after surgery. CSW contacted patient's son in law James Mcguire. James Mcguire confirmed that patient is from [facility] and has Medicaid. James Mcguire is agreeable for patient to return to [facility]. Per James Mcguire patient's wife was his HPOA however she  has passed away. Per James Mcguire patient's daughter James Mcguire is POA. CSW attempted to meet with patient however he was asleep.   FL2 complete. CSW will continue to follow and assist as needed.   Employment status:  Retired Health and safety inspector:  Medicaid In [place], [employer] PT Recommendations:  Not assessed at this time Information / Referral to community resources:  Skilled Nursing Facility  Patient/Family's Response to care:  Patient's son in law is agreeable for patient to return to [place].   Patient/Family's Understanding of and Emotional Response to Diagnosis, Current Treatment, and Prognosis: Patient's son in law James Mcguire was pleasant throughout assessment and thanked CSW for calling.   Emotional Assessment Appearance:  Appears stated age Attitude/Demeanor/Rapport:  Unable to Assess Affect (typically observed):  Unable to Assess Orientation:  Oriented to Self, Oriented to Place, Oriented to  Time, Fluctuating Orientation (Suspected and/or reported Sundowners) Alcohol / Substance use:  Not Applicable Psych involvement (Current and /or in the community):  No (Comment)  Discharge Needs  Concerns to be addressed:  Discharge Planning Concerns Readmission within the last 30 days:  No Current discharge risk:  Dependent with Mobility Barriers to Discharge:  Continued Medical Work up   Haig Prophet, LCSW 02/27/2015, 9:27 AM

## 2015-02-27 NOTE — Progress Notes (Addendum)
Initial Nutrition Assessment  DOCUMENTATION CODES:   Severe malnutrition in context of chronic illness  INTERVENTION:   Meals and Snacks: Cater to patient preferences s/p procedure as medically able. Per chart review, pt on Mechanically Altered diet order at [facility] with h/o dysphagia. RN Steward Drone aware; will monitor.  Coordination of Care: if pt at risk for aspiration, recommend SLP evaulation. Medical Food Supplement Therapy: will recommend Ensure Enlive po TID, each supplement provides 350 kcal and 20 grams of protein, once diet advanced, as pt drinks daily per report   NUTRITION DIAGNOSIS:   Malnutrition related to chronic illness as evidenced by severe depletion of muscle mass, severe depletion of body fat.  GOAL:   Patient will meet greater than or equal to 90% of their needs  MONITOR:    (Eenrgy intake, Electrolyte and renal Profile, Anthropometrics, Digestive System)  REASON FOR ASSESSMENT:   Consult Hip fracture protocol  ASSESSMENT:   Pt admitted with closed left hip fracture, scheduled for left THA 02/27/2015.  Past Medical History  Diagnosis Date  . Dementia   . Dysphagia   . Insomnia   . Failure to thrive in adult   . Emphysema of lung (HCC)   . Major depressive disorder (HCC)   . Dementia     Diet Order:  Diet NPO time specified Except for: Sips with Meds    Current Nutrition: Pt NPO last eaten, dinner last night per family report.  Food/Nutrition-Related History: Pt's family reports pt usually eats 3 meals a day, albeit not large meals but does eat pretty well. Per paper chart pt ordered Ensure Plus, to which pt reports drinking and liking very much PTA.   Scheduled Medications:  .  ceFAZolin (ANCEF) IV  1 g Intravenous Once  . [MAR Hold] clonazePAM  0.125 mg Oral BID  . [MAR Hold] mirtazapine  7.5 mg Oral QHS  . [MAR Hold] polyethylene glycol  17 g Oral Daily  . [MAR Hold] senna-docusate  2 tablet Oral BID  . [MAR Hold] sodium chloride  flush  3 mL Intravenous Q12H  . [MAR Hold] tiotropium  18 mcg Inhalation Daily    Continuous Medications:  . sodium chloride 100 mL/hr at 02/27/15 0745     Electrolyte/Renal Profile and Glucose Profile:   Recent Labs Lab 02/27/15 0335  NA 135  K 3.8  CL 104  CO2 23  BUN 34*  CREATININE 1.09  CALCIUM 8.7*  GLUCOSE 110*   Protein Profile:  Recent Labs Lab 02/27/15 0335  ALBUMIN 4.0    Gastrointestinal Profile: Last BM:  02/27/2015   Nutrition-Focused Physical Exam Findings: Nutrition-Focused physical exam completed. Findings are moderate-severe fat depletion, moderate-severe muscle depletion, and no edema.    Weight Change: Pt reports UBW of 125-130lbs. Current measured weight of 133lbs.   Height:   Ht Readings from Last 1 Encounters:  02/27/15  (1.676 m)    Weight:   Wt Readings from Last 1 Encounters:  02/27/15 133 lb 8 oz (60.555 kg)     BMI:  Body mass index is 21.56 kg/(m^2).  Estimated Nutritional Needs:   Kcal:  BEE: 1253kcals, TEE: (IF 1.1-1.3)(AF 1.2) 1654-1955kcals  Protein:  67-79g protein (1.1-1.3g/kg)  Fluid:  1515-1884mL of fluid (25-20mL/kg)  EDUCATION NEEDS:   No education needs identified at this time   HIGH Care Level   Leda Quail, RD, LDN Pager [phone] Weekend/On-Call Pager [phone]

## 2015-02-27 NOTE — OR Nursing (Signed)
Dr. Rosita Kea contacted twice about bleeding at the incision site.  Honeycomb dressing saturated and leaking the transparent dressing.  Dressing changed with 4x4's and abds with paper tape and pressure applied to site.  Some leakage out the back of this dressing and Dr. Rosita Kea said to continue to watch the site and apply a large 3 liter bag of fluid to put pressure on site.  Patient's vital signs remain stable.

## 2015-02-27 NOTE — ED Notes (Signed)
Pt arrives to ED via [facility] from [facility] d/t reported hip fracture following a fall around 11 pm last night. EMS reports facility performed an xray which showed a fracture in the LEFT hip. EMS reports pt is A&Ox4 but medical history from from facility shows h/x of dementia. Pt c/o left hip pain; respirations are even, regular and unlabored.

## 2015-02-27 NOTE — Op Note (Signed)
02/27/2015  6:01 PM  PATIENT:  James Mcguire  82 y.o. male  PRE-OPERATIVE DIAGNOSIS:  left hip fracture displaced femoral neck and osteoarthritis  POST-OPERATIVE DIAGNOSIS:  left hip fracture displaced femoral neck and osteoarthritis  PROCEDURE:  Procedure(s): TOTAL HIP ARTHROPLASTY ANTERIOR APPROACH (Left)  SURGEON: Leitha Schuller, MD  ASSISTANTS: None  ANESTHESIA:   general  EBL:  Total I/O In: 600 [I.V.:600] Out: 300 [Urine:300]  BLOOD ADMINISTERED:none  DRAINS: none   LOCAL MEDICATIONS USED:  MARCAINE     SPECIMEN:  Source of Specimen:  Femoral head left  DISPOSITION OF SPECIMEN:  PATHOLOGY  COUNTS:  YES  TOURNIQUET:  * No tourniquets in log *  IMPLANTS: Medacta AMIS 3 standard stem, 56 mm Mpact cup DM with liner and S 28 head  DICTATION: .Dragon Dictation   The patient was brought to the operating room and after spinal anesthesia was obtained patient was placed on the operative table with the ipsilateral foot into the Medacta attachment, contralateral leg on a well-padded table. C-arm was brought in and preop template x-ray taken. After prepping and draping in usual sterile fashion appropriate patient identification and timeout procedures were completed. Anterior approach to the hip was obtained and centered over the greater trochanter and TFL muscle. The subcutaneous tissue was incised hemostasis being achieved by electrocautery. TFL fascia was incised and the muscle retracted laterally deep retractor placed. The lateral femoral circumflex vessels were identified and ligated. The anterior capsule was exposed and a capsulotomy performed. The neck fracture was identified and a femoral neck cut carried out with a saw. The head was removed without difficulty and showed sclerotic femoral head and acetabulum. Reaming was carried out to 54 mm and a 56 mm cup trial gave appropriate tightness to the acetabular component a 56 cup was impacted into position. The leg was then  externally rotated and ischiofemoral and puboofemoral releases carried out. The femur was sequentially broached to a size 3, size 3 standard stem and S head trials were placed and the final components chosen. The 3 standard stem was inserted along with a S 28 mm head and 56 mm liner. The hip was reduced and was stable the wound was thoroughly irrigated with a dilute Betadine solution. The deep fascia view. Using a heavy Quill after infiltration of 30 cc of quarter percent Sensorcaine with epinephrine.2-0 Quill to close the skin with skin staples Xeroform and honeycomb dressing applied  PLAN OF CARE: Continue as inpatient

## 2015-02-27 NOTE — Transfer of Care (Signed)
Immediate Anesthesia Transfer of Care Note  Patient: James Mcguire  Procedure(s) Performed: Procedure(s): TOTAL HIP ARTHROPLASTY ANTERIOR APPROACH (Left)  Patient Location: PACU  Anesthesia Type:General  Level of Consciousness: sedated  Airway & Oxygen Therapy: Patient Spontanous Breathing and Patient connected to face mask oxygen  Post-op Assessment: Report given to RN and Post -op Vital signs reviewed and stable  Post vital signs: Reviewed and stable  Last Vitals:  Filed Vitals:   02/27/15 1149 02/27/15 1523  BP: 132/72 143/73  Pulse: 82 82  Temp: 37.2 C 37.1 C  Resp: 20 19    Complications: No apparent anesthesia complications

## 2015-02-27 NOTE — Care Management (Addendum)
RNCM consult received and will continue to follow. Apparently patient is from [facility]- CSW will follow.

## 2015-02-27 NOTE — NC FL2 (Signed)
[place] MEDICAID FL2 LEVEL OF CARE SCREENING TOOL     IDENTIFICATION  Patient Name: James Mcguire Birthdate: Feb 06, 1932 Sex: male Admission Date (Current Location): 02/27/2015  [facility] and [place] Number:  [address]  ([id number]) Facility and Address:  [facility], [address], [place], [place] [address]      Provider Number: [id number]  Attending Physician Name and Address:  Shaune Pollack, MD  Relative Name and Phone Number:       Current Level of Care: Hospital Recommended Level of Care: Skilled Nursing Facility Prior Approval Number:    Date Approved/Denied:   PASRR Number:  ( [id number] )  Discharge Plan: SNF    Current Diagnoses: Patient Active Problem List   Diagnosis Date Noted  . Closed left hip fracture (HCC) 02/27/2015    Orientation RESPIRATION BLADDER Height & Weight     Self, Time  O2 (2 Liters Oxygen ) Incontinent Weight: 133 lb 8 oz (60.555 kg) Height:   (167.6 cm)  BEHAVIORAL SYMPTOMS/MOOD NEUROLOGICAL BOWEL NUTRITION STATUS   (none )  (none ) Incontinent Diet (NPO for surgery )  AMBULATORY STATUS COMMUNICATION OF NEEDS Skin   Limited Assist Verbally Normal                       Personal Care Assistance Level of Assistance  Bathing, Feeding, Dressing Bathing Assistance: Limited assistance Feeding assistance: Independent Dressing Assistance: Limited assistance     Functional Limitations Info  Sight, Hearing, Speech Sight Info: Adequate Hearing Info: Adequate Speech Info: Adequate    SPECIAL CARE FACTORS FREQUENCY  PT (By licensed PT), OT (By licensed OT)     PT Frequency:  (5) OT Frequency:  (5)            Contractures      Additional Factors Info  Code Status Code Status Info:  (DNR )             Current Medications (02/27/2015):  This is the current hospital active medication list Current Facility-Administered Medications  Medication Dose Route Frequency Provider Last Rate Last  Dose  . 0.9 %  sodium chloride infusion   Intravenous Continuous Arnaldo Natal, MD      . acetaminophen (TYLENOL) tablet 650 mg  650 mg Oral Q6H PRN Arnaldo Natal, MD   650 mg at 02/27/15 0831   Or  . acetaminophen (TYLENOL) suppository 650 mg  650 mg Rectal Q6H PRN Arnaldo Natal, MD      . ceFAZolin (ANCEF) IVPB 1 g/50 mL premix  1 g Intravenous Once Kennedy Bucker, MD      . clonazepam [facility]) disintegrating tablet 0.125 mg  0.125 mg Oral BID Arnaldo Natal, MD      . clonazepam [facility]) disintegrating tablet 0.125 mg  0.125 mg Oral Daily PRN Arnaldo Natal, MD      . ipratropium-albuterol (DUONEB) 0.5-2.5 (3) MG/3ML nebulizer solution 3 mL  3 mL Nebulization Q6H Arnaldo Natal, MD   3 mL at 02/27/15 0847  . Menthol-Zinc Oxide 0.44-20.6 % OINT 1 application  1 application Apply externally PRN Arnaldo Natal, MD      . mirtazapine (REMERON) tablet 7.5 mg  7.5 mg Oral QHS Arnaldo Natal, MD      . morphine 2 MG/ML injection 2 mg  2 mg Intravenous Q3H PRN Arnaldo Natal, MD   2 mg at 02/27/15 0830  . morphine 2 MG/ML injection 2 mg  2 mg  Intravenous Q1H PRN Kennedy Bucker, MD   2 mg at 02/27/15 0506  . ondansetron (ZOFRAN) tablet 4 mg  4 mg Oral Q6H PRN Arnaldo Natal, MD       Or  . ondansetron [facility]) injection 4 mg  4 mg Intravenous Q6H PRN Arnaldo Natal, MD      . polyethylene glycol [facility] / GLYCOLAX) packet 17 g  17 g Oral Daily Arnaldo Natal, MD      . senna-docusate (Senokot-S) tablet 2 tablet  2 tablet Oral BID Arnaldo Natal, MD      . sodium chloride flush (NS) 0.9 % injection 3 mL  3 mL Intravenous Q12H Arnaldo Natal, MD      . traZODone (DESYREL) tablet 50 mg  50 mg Oral QHS PRN Arnaldo Natal, MD      . vitamin A & D ointment 1 application  1 application Topical PRN Arnaldo Natal, MD         Discharge Medications: Please see discharge summary for a list of discharge medications.  Relevant Imaging Results:  Relevant  Lab Results:   Additional Information  (SSN: [id number])  Haig Prophet, LCSW

## 2015-02-27 NOTE — Progress Notes (Signed)
Called family member Delton [place] informed of admission

## 2015-02-27 NOTE — Consult Note (Addendum)
Displaced left femoral neck fracture, L1 compression fracture.  Plan left THA later today if medically stable. Issue is nursing home resident who suffered a fall last evening is uncertain of the cause. He does suffer from dementia and is a poor historian. He reports that he ambulates without assistive device. He denies prodromal symptoms. He came to the emergency room was found to have a displaced femoral neck fracture with significant osteoarthritis. Being admitted for treatment of this. Additionally x-rays of the lumbar spine were obtained and show an L1 fracture age indeterminant. Complains of predominantly severe left hip pain and at this time did not complain of back pain  Physical exam left lower extremity is shortened and actually rotated with no edema does have palpable dorsalis pedis and posterior tibial pulse is able flex extend the toes on the left leg skin is intact about the hip without ecchymosis. He is slightly tender to the mid lumbar spine upper lumbar spine with slight deformity at L1  Clinical impression is displaced left femoral neck fracture L1 compression fracture  Plan left total hip anterior approach with treatment of L1 fracture dependent upon how he does with physical therapy. If he has severe pain in the back when he starts being mobilized he could have a kyphoplasty if it is minimally or asymptomatic we'll hold off on a treatment. Discussed this with his daughters prematurely for informed consent risks benefits possible competitions discussed

## 2015-02-27 NOTE — Progress Notes (Signed)
Transported to surgery

## 2015-02-27 NOTE — ED Notes (Signed)
Attempted to call report at [phone]. Aggie Cosier, RN stated the floor was currently involved in a Rapid Response situation and would call this RN in the ED (ASCOM [phone]) when the accepting RN is available to receive report.

## 2015-02-27 NOTE — Progress Notes (Signed)
[facility] [place] at [facility]   PATIENT NAME: James Mcguire    MR#:  [id number]  DATE OF BIRTH:  [birth year]  SUBJECTIVE:  CHIEF COMPLAINT:   Chief Complaint  Patient presents with  . Fall  left hip pain.  REVIEW OF SYSTEMS:  CONSTITUTIONAL: No fever, fatigue or weakness.  EYES: No blurred or double vision.  EARS, NOSE, AND THROAT: No tinnitus or ear pain.  RESPIRATORY: No cough, shortness of breath, wheezing or hemoptysis.  CARDIOVASCULAR: No chest pain, orthopnea, edema.  GASTROINTESTINAL: No nausea, vomiting, diarrhea or abdominal pain.  GENITOURINARY: No dysuria, hematuria.  ENDOCRINE: No polyuria, nocturia,  HEMATOLOGY: No anemia, easy bruising or bleeding SKIN: No rash or lesion. MUSCULOSKELETAL: left hip pain. NEUROLOGIC: No tingling, numbness, weakness.  PSYCHIATRY: No anxiety or depression.   DRUG ALLERGIES:  No Known Allergies  VITALS:  Blood pressure 143/73, pulse 82, temperature 98.8 F (37.1 C), temperature source Oral, resp. rate 19, height  (1.676 m), weight 60.555 kg (133 lb 8 oz), SpO2 94 %.  PHYSICAL EXAMINATION:  GENERAL:  82 y.o.-year-old patient lying in the bed with no acute distress.  EYES: Pupils equal, round, reactive to light and accommodation. No scleral icterus. Extraocular muscles intact.  HEENT: Head atraumatic, normocephalic. Oropharynx and nasopharynx clear.  NECK:  Supple, no jugular venous distention. No thyroid enlargement, no tenderness.  LUNGS: Normal breath sounds bilaterally, no wheezing, rales,rhonchi or crepitation. No use of accessory muscles of respiration.  CARDIOVASCULAR: S1, S2 normal. No murmurs, rubs, or gallops.  ABDOMEN: Soft, nontender, nondistended. Bowel sounds present. No organomegaly or mass.  EXTREMITIES: No pedal edema, cyanosis, or clubbing.  NEUROLOGIC: Cranial nerves II through XII are intact. Unable to exam Muscle strength due to fracture. PSYCHIATRIC: The patient is alert  and oriented x 3.  SKIN: No obvious rash, lesion, or ulcer.    LABORATORY PANEL:   CBC  Recent Labs Lab 02/27/15 0335  WBC 12.7*  HGB 9.7*  HCT 29.3*  PLT 308   ------------------------------------------------------------------------------------------------------------------  Chemistries   Recent Labs Lab 02/27/15 0335  NA 135  K 3.8  CL 104  CO2 23  GLUCOSE 110*  BUN 34*  CREATININE 1.09  CALCIUM 8.7*  AST 25  ALT 17  ALKPHOS 60  BILITOT 0.7   ------------------------------------------------------------------------------------------------------------------  Cardiac Enzymes  Recent Labs Lab 02/27/15 0335  TROPONINI 0.04*   ------------------------------------------------------------------------------------------------------------------  RADIOLOGY:  Dg Chest 1 View  02/27/2015  CLINICAL DATA:  Hip fracture after a fall around 11 p.m. last night. EXAM: CHEST 1 VIEW COMPARISON:  03/25/2014 FINDINGS: Emphysematous changes in the lungs. Normal heart size and pulmonary vascularity. No focal airspace disease or consolidation in the lungs. No blunting of costophrenic angles. Apical pleural calcifications. No pneumothorax. Mediastinal contours appear intact. Old bilateral rib fractures. Tortuous aorta. IMPRESSION: Emphysematous changes in the lungs. No evidence of active pulmonary disease. Electronically Signed   By: Burman Nieves M.D.   On: 02/27/2015 04:28   Dg Lumbar Spine Complete  02/27/2015  CLINICAL DATA:  Have fracture after a fall at 11 p.m. last night. EXAM: LUMBAR SPINE - COMPLETE 4+ VIEW COMPARISON:  None. FINDINGS: Compression fracture of the L1 vertebra with approximately 40% loss of height. Age is indeterminate. No retropulsion of fracture fragments. Normal alignment of the lumbar spine. Degenerative changes throughout with narrowed interspaces and endplate hypertrophic changes. Vascular calcifications. Visualize sacrum appears intact. IMPRESSION:  Compression of the L1 vertebra of indeterminate age. Normal alignment of the lumbar spine. Degenerative  changes. Electronically Signed   By: Burman Nieves M.D.   On: 02/27/2015 04:27   Ct Head Wo Contrast  02/27/2015  CLINICAL DATA:  Fall with head injury.  History of dementia. EXAM: CT HEAD WITHOUT CONTRAST TECHNIQUE: Contiguous axial images were obtained from the base of the skull through the vertex without intravenous contrast. COMPARISON:  CT head October 05, 2014 FINDINGS: The ventricles and sulci are normal for age. No intraparenchymal hemorrhage, mass effect nor midline shift. Patchy supratentorial white matter hypodensities are within normal range for patient's age and though non-specific suggest sequelae of chronic small vessel ischemic disease. No acute large vascular territory infarcts. No abnormal extra-axial fluid collections. Basal cisterns are patent. Moderate calcific atherosclerosis of the carotid siphons. No skull fracture. The included ocular globes and orbital contents are non-suspicious. Mild suspected enophthalmos. The mastoid aircells and included paranasal sinuses are well-aerated. Moderate RIGHT temporomandibular osteoarthrosis. IMPRESSION: No acute intracranial process. Stable involutional changes and moderate chronic small vessel ischemic disease. Electronically Signed   By: Awilda Metro M.D.   On: 02/27/2015 03:58   Dg Hip Unilat With Pelvis 2-3 Views Left  02/27/2015  CLINICAL DATA:  Hip fracture after a fall around 11 p.m. last night. EXAM: DG HIP (WITH OR WITHOUT PELVIS) 2-3V LEFT COMPARISON:  None. FINDINGS: Acute transverse fracture of the left femoral neck with superior subluxation of the distal fracture fragment resulting in varus angulation. No evidence of involvement of the inter trochanteric portion of the left hip. No dislocation at the left hip joint. Mild degenerative changes in the hips. Pelvis appears intact without evidence of acute displaced fracture. SI  joints and symphysis pubis are not displaced. Visualized sacrum appears intact. IMPRESSION: Acute fracture of the left femoral neck with varus angulation. Electronically Signed   By: Burman Nieves M.D.   On: 02/27/2015 04:25    EKG:   Orders placed or performed during the hospital encounter of 10/05/14  . EKG 12-Lead  . EKG 12-Lead  . EKG    ASSESSMENT AND PLAN:   1. Left hip fracture:  Hip surgery today. Pain control, DVT prophylaxis after surgery.  2. Dehydration. IVF and b/u BMP.   3. Anemia of chronic disease. F/u Hb after surgery.  4. COPD with emphasema. Stable. NEB prn.  5. Depression: Continue Remeron (may be a component of dementia as well)  6. Dementia. Aspiration and fall precaution.   All the records are reviewed and case discussed with Care Management/Social Workerr. Management plans discussed with the patient, family and they are in agreement.  CODE STATUS: DNR  TOTAL TIME TAKING CARE OF THIS PATIENT: 37 minutes.  Greater than 50% time was spent on coordination of care and face-to-face counseling.  POSSIBLE D/C IN 3 DAYS, DEPENDING ON CLINICAL CONDITION.   Shaune Pollack M.D on 02/27/2015 at 4:14 PM  Between 7am to 6pm - Pager - [phone]  After 6pm go to [email or web address] - password [email or web address] [facility]  [facility]  Office  [phone]  CC: Primary care physician; Keane Police, MD

## 2015-02-27 NOTE — Anesthesia Preprocedure Evaluation (Signed)
Anesthesia Evaluation  Patient identified by MRN, date of birth, ID band Patient awake    Reviewed: Allergy & Precautions, H&P , NPO status , Patient's Chart, lab work & pertinent test results, reviewed documented beta blocker date and time   Airway Mallampati: III  TM Distance: >3 FB Neck ROM: full    Dental no notable dental hx. (+) Edentulous Upper, Edentulous Lower   Pulmonary shortness of breath and with exertion, neg sleep apnea, COPD,  COPD inhaler, neg recent URI, former smoker,    Pulmonary exam normal breath sounds clear to auscultation       Cardiovascular Exercise Tolerance: Good negative cardio ROS Normal cardiovascular exam Rhythm:regular Rate:Normal     Neuro/Psych PSYCHIATRIC DISORDERS (Depression and dementia) negative neurological ROS     GI/Hepatic negative GI ROS, Neg liver ROS,   Endo/Other  negative endocrine ROS  Renal/GU negative Renal ROS  negative genitourinary   Musculoskeletal   Abdominal   Peds  Hematology negative hematology ROS (+)   Anesthesia Other Findings Past Medical History:   Dementia                                                     Dysphagia                                                    Insomnia                                                     Failure to thrive in adult                                   Emphysema of lung (HCC)                                      Major depressive disorder (HCC)                              Dementia                                                     Reproductive/Obstetrics negative OB ROS                             Anesthesia Physical Anesthesia Plan  ASA: III  Anesthesia Plan: General   Post-op Pain Management:    Induction:   Airway Management Planned:   Additional Equipment:   Intra-op Plan:   Post-operative Plan:   Informed Consent: I have reviewed the patients History and Physical,  chart, labs and discussed the procedure including the risks, benefits and alternatives for  the proposed anesthesia with the patient or authorized representative who has indicated his/her understanding and acceptance.   Dental Advisory Given  Plan Discussed with: Anesthesiologist, CRNA and Surgeon  Anesthesia Plan Comments:         Anesthesia Quick Evaluation

## 2015-02-27 NOTE — OR Nursing (Signed)
Dr. Rosita Kea called back and is coming in to see the patient.  Patient resting quietly and vital signs stable.

## 2015-02-27 NOTE — H&P (Signed)
James Mcguire is an 82 y.o. male.    Chief Complaint: Fall HPI: The patient presents emergency department after a fall at his nursing home. He states he cannot remember how he fell while he was out of bed. He denies hitting his head or losing consciousness. The patient initially complained of left hip pain but now complains of left lower extremity pain. X-ray evaluation in the emergency department revealed fracture of the left femoral neck. He denies feeling ill. The patient also denies chest pain, shortness of breath, nausea, vomiting or diaphoresis.  After the patient's hip was immobilized emergency department staff called for admission.  Past Medical History  Diagnosis Date  . Dementia   . Dysphagia   . Insomnia   . Failure to thrive in adult   . Emphysema of lung ([place])   . Major depressive disorder ([place])   . Dementia     Past Surgical History  Procedure Laterality Date  . None      No family history on file. none Social History:  reports that he has never smoked. He does not have any smokeless tobacco history on file. He reports that he does not drink alcohol or use illicit drugs.  Allergies: No Known Allergies  Prior to Admission medications   Medication Sig Start Date End Date Taking? Authorizing Provider  acetaminophen (TYLENOL) 325 MG tablet Take 650 mg by mouth every 8 (eight) hours. 02/27/15 03/01/15 Yes Historical Provider, MD  acetaminophen (TYLENOL) 500 MG tablet Take 500 mg by mouth every 4 (four) hours as needed for moderate pain or fever.   Yes Historical Provider, MD  clonazepam (KLONOPIN) 0.125 MG disintegrating tablet Take 0.125 mg by mouth daily.    Yes Historical Provider, MD  clonazepam (KLONOPIN) 0.125 MG disintegrating tablet Take 0.125 mg by mouth daily as needed (anxiety).   Yes Historical Provider, MD  HYDROcodone-acetaminophen (NORCO/VICODIN) 5-325 MG tablet Take 1 tablet by mouth every 4 (four) hours.   Yes Historical Provider, MD  Ipratropium-Albuterol  (COMBIVENT RESPIMAT) 20-100 MCG/ACT AERS respimat Inhale 1 puff into the lungs 3 (three) times daily.   Yes Historical Provider, MD  ipratropium-albuterol (DUONEB) 0.5-2.5 (3) MG/3ML SOLN Take 3 mLs by nebulization every 6 (six) hours as needed (Shortness of Breath/ Wheezing).    Yes Historical Provider, MD  mirtazapine (REMERON) 15 MG tablet Take 15 mg by mouth at bedtime.    Yes Historical Provider, MD  polyethylene glycol (MIRALAX / GLYCOLAX) packet Take 17 g by mouth daily.   Yes Historical Provider, MD  senna-docusate (SENOKOT-S) 8.6-50 MG per tablet Take 2 tablets by mouth 2 (two) times daily.   Yes Historical Provider, MD  tiotropium (SPIRIVA) 18 MCG inhalation capsule Place 18 mcg into inhaler and inhale daily.   Yes Historical Provider, MD  Vitamin D, Ergocalciferol, (DRISDOL) 50000 units CAPS capsule Take 50,000 Units by mouth every 30 (thirty) days.   Yes Historical Provider, MD     Results for orders placed or performed during the hospital encounter of 02/27/15 (from the past 48 hour(s))  CBC     Status: Abnormal   Collection Time: 02/27/15  3:35 AM  Result Value Ref Range   WBC 12.7 (H) 3.8 - 10.6 K/uL   RBC 3.17 (L) 4.40 - 5.90 MIL/uL   Hemoglobin 9.7 (L) 13.0 - 18.0 g/dL   HCT 29.3 (L) 40.0 - 52.0 %   MCV 92.5 80.0 - 100.0 fL   MCH 30.6 26.0 - 34.0 pg   MCHC 33.1 32.0 -  36.0 g/dL   RDW 13.3 11.5 - 14.5 %   Platelets 308 150 - 440 K/uL  Comprehensive metabolic panel     Status: Abnormal   Collection Time: 02/27/15  3:35 AM  Result Value Ref Range   Sodium 135 135 - 145 mmol/L   Potassium 3.8 3.5 - 5.1 mmol/L   Chloride 104 101 - 111 mmol/L   CO2 23 22 - 32 mmol/L   Glucose, Bld 110 (H) 65 - 99 mg/dL   BUN 34 (H) 6 - 20 mg/dL   Creatinine, Ser 1.09 0.61 - 1.24 mg/dL   Calcium 8.7 (L) 8.9 - 10.3 mg/dL   Total Protein 6.8 6.5 - 8.1 g/dL   Albumin 4.0 3.5 - 5.0 g/dL   AST 25 15 - 41 U/L   ALT 17 17 - 63 U/L   Alkaline Phosphatase 60 38 - 126 U/L   Total Bilirubin 0.7  0.3 - 1.2 mg/dL   GFR calc non Af Amer >60 >60 mL/min   GFR calc Af Amer >60 >60 mL/min    Comment: (NOTE) The eGFR has been calculated using the CKD EPI equation. This calculation has not been validated in all clinical situations. eGFR's persistently <60 mL/min signify possible Chronic Kidney Disease.    Anion gap 8 5 - 15  Troponin I     Status: Abnormal   Collection Time: 02/27/15  3:35 AM  Result Value Ref Range   Troponin I 0.04 (H) <0.031 ng/mL    Comment: READ BACK AND VERIFIED WITH BUTCH WOODS AT 0426 ON 02/27/15 BY VAB        PERSISTENTLY INCREASED TROPONIN VALUES IN THE RANGE OF 0.04-0.49 ng/mL CAN BE SEEN IN:       -UNSTABLE ANGINA       -CONGESTIVE HEART FAILURE       -MYOCARDITIS       -CHEST TRAUMA       -ARRYHTHMIAS       -LATE PRESENTING MYOCARDIAL INFARCTION       -COPD   CLINICAL FOLLOW-UP RECOMMENDED.   Type and screen [place]     Status: None   Collection Time: 02/27/15  4:19 AM  Result Value Ref Range   ABO/RH(D) A POS    Antibody Screen NEG    Sample Expiration 03/02/2015    Dg Chest 1 View  02/27/2015  CLINICAL DATA:  Hip fracture after a fall around 11 p.m. last night. EXAM: CHEST 1 VIEW COMPARISON:  03/25/2014 FINDINGS: Emphysematous changes in the lungs. Normal heart size and pulmonary vascularity. No focal airspace disease or consolidation in the lungs. No blunting of costophrenic angles. Apical pleural calcifications. No pneumothorax. Mediastinal contours appear intact. Old bilateral rib fractures. Tortuous aorta. IMPRESSION: Emphysematous changes in the lungs. No evidence of active pulmonary disease. Electronically Signed   By: Lucienne Capers M.D.   On: 02/27/2015 04:28   Dg Lumbar Spine Complete  02/27/2015  CLINICAL DATA:  Have fracture after a fall at 11 p.m. last night. EXAM: LUMBAR SPINE - COMPLETE 4+ VIEW COMPARISON:  None. FINDINGS: Compression fracture of the L1 vertebra with approximately 40% loss of height. Age  is indeterminate. No retropulsion of fracture fragments. Normal alignment of the lumbar spine. Degenerative changes throughout with narrowed interspaces and endplate hypertrophic changes. Vascular calcifications. Visualize sacrum appears intact. IMPRESSION: Compression of the L1 vertebra of indeterminate age. Normal alignment of the lumbar spine. Degenerative changes. Electronically Signed   By: Lucienne Capers M.D.   On:  02/27/2015 04:27   Ct Head Wo Contrast  02/27/2015  CLINICAL DATA:  Fall with head injury.  History of dementia. EXAM: CT HEAD WITHOUT CONTRAST TECHNIQUE: Contiguous axial images were obtained from the base of the skull through the vertex without intravenous contrast. COMPARISON:  CT head October 05, 2014 FINDINGS: The ventricles and sulci are normal for age. No intraparenchymal hemorrhage, mass effect nor midline shift. Patchy supratentorial white matter hypodensities are within normal range for patient's age and though non-specific suggest sequelae of chronic small vessel ischemic disease. No acute large vascular territory infarcts. No abnormal extra-axial fluid collections. Basal cisterns are patent. Moderate calcific atherosclerosis of the carotid siphons. No skull fracture. The included ocular globes and orbital contents are non-suspicious. Mild suspected enophthalmos. The mastoid aircells and included paranasal sinuses are well-aerated. Moderate RIGHT temporomandibular osteoarthrosis. IMPRESSION: No acute intracranial process. Stable involutional changes and moderate chronic small vessel ischemic disease. Electronically Signed   By: Elon Alas M.D.   On: 02/27/2015 03:58   Dg Hip Unilat With Pelvis 2-3 Views Left  02/27/2015  CLINICAL DATA:  Hip fracture after a fall around 11 p.m. last night. EXAM: DG HIP (WITH OR WITHOUT PELVIS) 2-3V LEFT COMPARISON:  None. FINDINGS: Acute transverse fracture of the left femoral neck with superior subluxation of the distal fracture fragment  resulting in varus angulation. No evidence of involvement of the inter trochanteric portion of the left hip. No dislocation at the left hip joint. Mild degenerative changes in the hips. Pelvis appears intact without evidence of acute displaced fracture. SI joints and symphysis pubis are not displaced. Visualized sacrum appears intact. IMPRESSION: Acute fracture of the left femoral neck with varus angulation. Electronically Signed   By: Lucienne Capers M.D.   On: 02/27/2015 04:25    Review of Systems  Constitutional: Negative for fever and chills.  HENT: Negative for sore throat and tinnitus.   Eyes: Negative for blurred vision and redness.  Respiratory: Negative for cough and shortness of breath.   Cardiovascular: Negative for chest pain, palpitations, orthopnea and PND.  Gastrointestinal: Negative for nausea, vomiting, abdominal pain and diarrhea.  Genitourinary: Negative for dysuria, urgency and frequency.  Musculoskeletal: Positive for joint pain. Negative for myalgias.  Skin: Negative for rash.       No lesions  Neurological: Negative for speech change, focal weakness and weakness.  Endo/Heme/Allergies: Does not bruise/bleed easily.       No temperature intolerance  Psychiatric/Behavioral: Negative for depression and suicidal ideas.    Blood pressure 156/76, pulse 98, temperature 99.6 F (37.6 C), temperature source Oral, resp. rate 18, height _0  (1.676 m), weight 60.555 kg (133 lb 8 oz), SpO2 94 %. Physical Exam  Nursing note and vitals reviewed. Constitutional: He is oriented to person, place, and time. He appears well-developed and well-nourished. No distress.  HENT:  Head: Normocephalic and atraumatic.  Mouth/Throat: Oropharynx is clear and moist.  Eyes: Conjunctivae and EOM are normal. Pupils are equal, round, and reactive to light. No scleral icterus.  Neck: Normal range of motion. Neck supple. No JVD present. No tracheal deviation present. No thyromegaly present.   Cardiovascular: Normal rate, regular rhythm and normal heart sounds.  Exam reveals no gallop and no friction rub.   No murmur heard. Respiratory: Effort normal and breath sounds normal. No respiratory distress.  GI: Soft. Bowel sounds are normal. He exhibits no distension. There is no tenderness.  Genitourinary:  Deferred  Musculoskeletal: Normal range of motion. He exhibits no edema.  Left  lower extremity shortened and externally rotated  Lymphadenopathy:    He has no cervical adenopathy.  Neurological: He is alert and oriented to person, place, and time. No cranial nerve deficit.  Skin: Skin is warm and dry. No rash noted. No erythema.  Psychiatric: He has a normal mood and affect. His behavior is normal. Judgment and thought content normal.     Assessment/Plan This is an 82 year old Caucasian male admitted for left hip fracture. 1. Left hip fracture: Orthopedic surgery has been consulted. Goal Is to manage pain for now. Patient is currently not in traction but does not complain of pain at the hip. I have deferred anticoagulation in anticipation of possible surgery this morning. The patient is moderate to high risk due to advanced age but has no chronic medical problems. (The patient takes inhalers generally used to manage COPD however he is a nonsmoker and denies any respiratory difficulties; also there is duplication of some of his medication which could indicate that he is no longer taking these meds or does not need these meds any longer). May proceed with surgery. 2. Depression: Continue Remeron (may be a component of dementia as well) 3. Vitamin D deficiency: Continue supplementation 4. DVT prophylaxis: SCDs 5. GI prophylaxis: None The patient is a full code. Time spent on admission orders and patient care approximately 35 minutes  Harrie Foreman, MD 02/27/2015, 7:10 AM

## 2015-02-28 ENCOUNTER — Encounter: Payer: Self-pay | Admitting: Orthopedic Surgery

## 2015-02-28 DIAGNOSIS — E43 Unspecified severe protein-calorie malnutrition: Secondary | ICD-10-CM

## 2015-02-28 LAB — BASIC METABOLIC PANEL
ANION GAP: 6 (ref 5–15)
BUN: 21 mg/dL — ABNORMAL HIGH (ref 6–20)
CALCIUM: 8.1 mg/dL — AB (ref 8.9–10.3)
CHLORIDE: 109 mmol/L (ref 101–111)
CO2: 22 mmol/L (ref 22–32)
Creatinine, Ser: 0.99 mg/dL (ref 0.61–1.24)
GFR calc non Af Amer: >60 mL/min (ref >60)
Glucose, Bld: 104 mg/dL — ABNORMAL HIGH (ref 65–99)
POTASSIUM: 4 mmol/L (ref 3.5–5.1)
Sodium: 137 mmol/L (ref 135–145)

## 2015-02-28 LAB — CBC WITH DIFFERENTIAL/PLATELET
BASOS ABS: 0 10*3/uL (ref 0–0.1)
Basophils Relative: 0 %
Eosinophils Absolute: 0.1 10*3/uL (ref 0–0.7)
Eosinophils Relative: 2 %
HEMATOCRIT: 22.9 % — AB (ref 40.0–52.0)
HEMOGLOBIN: 7.7 g/dL — AB (ref 13.0–18.0)
LYMPHS PCT: 10 %
Lymphs Abs: 0.8 10*3/uL — ABNORMAL LOW (ref 1.0–3.6)
MCH: 31.7 pg (ref 26.0–34.0)
MCHC: 33.6 g/dL (ref 32.0–36.0)
MCV: 94.5 fL (ref 80.0–100.0)
Monocytes Absolute: 0.9 10*3/uL (ref 0.2–1.0)
Monocytes Relative: 12 %
NEUTROS ABS: 6 10*3/uL (ref 1.4–6.5)
Neutrophils Relative %: 76 %
Platelets: 230 10*3/uL (ref 150–440)
RBC: 2.43 MIL/uL — AB (ref 4.40–5.90)
RDW: 13.5 % (ref 11.5–14.5)
WBC: 7.9 10*3/uL (ref 3.8–10.6)

## 2015-02-28 LAB — CBC
HEMATOCRIT: 26.2 % — AB (ref 40.0–52.0)
HEMOGLOBIN: 9 g/dL — AB (ref 13.0–18.0)
MCH: 31.8 pg (ref 26.0–34.0)
MCHC: 34.3 g/dL (ref 32.0–36.0)
MCV: 92.6 fL (ref 80.0–100.0)
Platelets: 262 10*3/uL (ref 150–440)
RBC: 2.82 MIL/uL — AB (ref 4.40–5.90)
RDW: 13.8 % (ref 11.5–14.5)
WBC: 7.7 10*3/uL (ref 3.8–10.6)

## 2015-02-28 LAB — PREPARE RBC (CROSSMATCH)

## 2015-02-28 MED ORDER — SODIUM CHLORIDE 0.9 % IV BOLUS (SEPSIS): 500.0000 mL | Freq: Once | INTRAVENOUS | Status: DC

## 2015-02-28 MED ORDER — FE FUMARATE-B12-VIT C-FA-IFC PO CAPS
1.0000 | ORAL_CAPSULE | Freq: Three times a day (TID) | ORAL | Status: DC
  Administered 2015-02-28 – 2015-03-03 (×10): 1 via ORAL
  Filled 2015-02-28 (×11): qty 1

## 2015-02-28 MED ORDER — CEFAZOLIN SODIUM 1-5 GM-% IV SOLN
INTRAVENOUS | Status: DC | PRN
  Administered 2015-02-27: 1 g via INTRAVENOUS

## 2015-02-28 MED ORDER — SODIUM CHLORIDE 0.9 % IV SOLN: Freq: Once | INTRAVENOUS | Status: DC

## 2015-02-28 MED ORDER — SODIUM CHLORIDE 0.9 % IV BOLUS (SEPSIS)
500.0000 mL | Freq: Once | INTRAVENOUS | Status: AC
  Administered 2015-02-28: 500 mL via INTRAVENOUS

## 2015-02-28 MED ORDER — ACETAMINOPHEN 325 MG PO TABS: 650.0000 mg | ORAL_TABLET | Freq: Once | ORAL | Status: DC

## 2015-02-28 NOTE — Progress Notes (Signed)
[facility] [place] at [facility]   PATIENT NAME: James Mcguire    MR#:  [id number]  DATE OF BIRTH:  02/13/1932  SUBJECTIVE:  CHIEF COMPLAINT:   Chief Complaint  Patient presents with  . Fall  No complaint, but looks lethargy. BP was low at 80/50's, given NS bolus, but still low. He is being given another NS bolus. Low urine output. Per RN, lost a lot of blood during surgery. No melena or bloody stool.  REVIEW OF SYSTEMS:  CONSTITUTIONAL: No fever, has weakness.  EYES: No blurred or double vision.  EARS, NOSE, AND THROAT: No tinnitus or ear pain.  RESPIRATORY: No cough, shortness of breath, wheezing or hemoptysis.  CARDIOVASCULAR: No chest pain, orthopnea, edema.  GASTROINTESTINAL: No nausea, vomiting, diarrhea or abdominal pain.  GENITOURINARY: No dysuria, hematuria.  ENDOCRINE: No polyuria, nocturia,  HEMATOLOGY: No anemia, easy bruising or bleeding SKIN: No rash or lesion. MUSCULOSKELETAL: left hip pain. NEUROLOGIC: No tingling, numbness, weakness.  PSYCHIATRY: No anxiety or depression.   DRUG ALLERGIES:  No Known Allergies  VITALS:  Blood pressure 94/50, pulse 80, temperature 98.2 F (36.8 C), temperature source Oral, resp. rate 17, height 5\' 6"  (1.676 m), weight 60.011 kg (132 lb 4.8 oz), SpO2 95 %.  PHYSICAL EXAMINATION:  GENERAL:  82 y.o.-year-old patient lying in the bed with lethargy.  EYES: Pupils equal, round, reactive to light and accommodation. No scleral icterus. Extraocular muscles intact. Pale conjunctiva. HEENT: Head atraumatic, normocephalic. Oropharynx and nasopharynx clear.  NECK:  Supple, no jugular venous distention. No thyroid enlargement, no tenderness.  LUNGS: Normal breath sounds bilaterally, no wheezing, rales,rhonchi or crepitation. No use of accessory muscles of respiration.  CARDIOVASCULAR: S1, S2 normal. No murmurs, rubs, or gallops.  ABDOMEN: Soft, nontender, nondistended. Bowel sounds present. No organomegaly or  mass.  EXTREMITIES: No pedal edema, cyanosis, or clubbing.  NEUROLOGIC: Cranial nerves II through XII are intact. Unable to exam Muscle strength due to fracture. PSYCHIATRIC: The patient is alert and oriented x 2, confused.  SKIN: No obvious rash, lesion, or ulcer.    LABORATORY PANEL:   CBC  Recent Labs Lab 02/28/15 1301  WBC 7.9  HGB 7.7*  HCT 22.9*  PLT 230   ------------------------------------------------------------------------------------------------------------------  Chemistries   Recent Labs Lab 02/27/15 0335 02/28/15 0554  NA 135 137  K 3.8 4.0  CL 104 109  CO2 23 22  GLUCOSE 110* 104*  BUN 34* 21*  CREATININE 1.09 0.99  CALCIUM 8.7* 8.1*  AST 25  --   ALT 17  --   ALKPHOS 60  --   BILITOT 0.7  --    ------------------------------------------------------------------------------------------------------------------  Cardiac Enzymes  Recent Labs Lab 02/27/15 0335  TROPONINI 0.04*   ------------------------------------------------------------------------------------------------------------------  RADIOLOGY:  Dg Chest 1 View  02/27/2015  CLINICAL DATA:  Hip fracture after a fall around 11 p.m. last night. EXAM: CHEST 1 VIEW COMPARISON:  03/25/2014 FINDINGS: Emphysematous changes in the lungs. Normal heart size and pulmonary vascularity. No focal airspace disease or consolidation in the lungs. No blunting of costophrenic angles. Apical pleural calcifications. No pneumothorax. Mediastinal contours appear intact. Old bilateral rib fractures. Tortuous aorta. IMPRESSION: Emphysematous changes in the lungs. No evidence of active pulmonary disease. Electronically Signed   By: Burman Nieves M.D.   On: 02/27/2015 04:28   Dg Lumbar Spine Complete  02/27/2015  CLINICAL DATA:  Have fracture after a fall at 11 p.m. last night. EXAM: LUMBAR SPINE - COMPLETE 4+ VIEW COMPARISON:  None. FINDINGS: Compression  fracture of the L1 vertebra with approximately 40% loss of  height. Age is indeterminate. No retropulsion of fracture fragments. Normal alignment of the lumbar spine. Degenerative changes throughout with narrowed interspaces and endplate hypertrophic changes. Vascular calcifications. Visualize sacrum appears intact. IMPRESSION: Compression of the L1 vertebra of indeterminate age. Normal alignment of the lumbar spine. Degenerative changes. Electronically Signed   By: Burman Nieves M.D.   On: 02/27/2015 04:27   Ct Head Wo Contrast  02/27/2015  CLINICAL DATA:  Fall with head injury.  History of dementia. EXAM: CT HEAD WITHOUT CONTRAST TECHNIQUE: Contiguous axial images were obtained from the base of the skull through the vertex without intravenous contrast. COMPARISON:  CT head October 05, 2014 FINDINGS: The ventricles and sulci are normal for age. No intraparenchymal hemorrhage, mass effect nor midline shift. Patchy supratentorial white matter hypodensities are within normal range for patient's age and though non-specific suggest sequelae of chronic small vessel ischemic disease. No acute large vascular territory infarcts. No abnormal extra-axial fluid collections. Basal cisterns are patent. Moderate calcific atherosclerosis of the carotid siphons. No skull fracture. The included ocular globes and orbital contents are non-suspicious. Mild suspected enophthalmos. The mastoid aircells and included paranasal sinuses are well-aerated. Moderate RIGHT temporomandibular osteoarthrosis. IMPRESSION: No acute intracranial process. Stable involutional changes and moderate chronic small vessel ischemic disease. Electronically Signed   By: Awilda Metro M.D.   On: 02/27/2015 03:58   Dg Hip Operative Unilat W Or W/o Pelvis Left  02/27/2015  CLINICAL DATA:  Hip replacement EXAM: OPERATIVE left HIP (WITH PELVIS IF PERFORMED) 3 VIEWS TECHNIQUE: Fluoroscopic spot image(s) were submitted for interpretation post-operatively. COMPARISON:  X-rays of earlier the same day. FINDINGS:  Three intraoperative spot fluoro films are included on this study. There is no laterality marker on any of the images. The first image is of a hip joint showing femoral neck fracture. Given that the left femoral neck was fractured on the films performed earlier the same day, the working assumption for this exam is that it is of the left hip. The next image shows left hip prosthesis with sizing component. Final image again shows hip replacement hardware although the femoral component has been incompletely visualized. IMPRESSION: Intraoperative assessment during left hip replacement. Incomplete visualization of the femoral stem. Electronically Signed   By: Kennith Center M.D.   On: 02/27/2015 18:01   Dg Hip Unilat W Or W/o Pelvis 2-3 Views Left  02/27/2015  CLINICAL DATA:  82 year old male status post left hip replacement EXAM: DG HIP (WITH OR WITHOUT PELVIS) 2-3V LEFT COMPARISON:  Preoperative radiographs 02/27/2015 at 3:55 a.m. FINDINGS: Interval surgical changes of left hip arthroplasty without evidence of immediate hardware complication. The visualized bony pelvis is intact. Atherosclerotic calcifications present in the superficial femoral artery. IMPRESSION: Postoperative changes of left hip arthroplasty without evidence of immediate complication. Atherosclerotic vascular calcifications. Electronically Signed   By: Malachy Moan M.D.   On: 02/27/2015 18:54   Dg Hip Unilat With Pelvis 2-3 Views Left  02/27/2015  CLINICAL DATA:  Hip fracture after a fall around 11 p.m. last night. EXAM: DG HIP (WITH OR WITHOUT PELVIS) 2-3V LEFT COMPARISON:  None. FINDINGS: Acute transverse fracture of the left femoral neck with superior subluxation of the distal fracture fragment resulting in varus angulation. No evidence of involvement of the inter trochanteric portion of the left hip. No dislocation at the left hip joint. Mild degenerative changes in the hips. Pelvis appears intact without evidence of acute displaced  fracture. SI joints  and symphysis pubis are not displaced. Visualized sacrum appears intact. IMPRESSION: Acute fracture of the left femoral neck with varus angulation. Electronically Signed   By: Burman Nieves M.D.   On: 02/27/2015 04:25    EKG:   Orders placed or performed during the hospital encounter of 10/05/14  . EKG 12-Lead  . EKG 12-Lead  . EKG    ASSESSMENT AND PLAN:   1. Left hip fracture:  Hip surgery POD1. Pain control, DVT prophylaxis..  2. Dehydration. IVF and b/u BMP.   3. Anemia of chronic disease plus acute blood loss.  PRBC transfusion 2 units, F/u Hb.  4. COPD with emphasema. Stable. NEB prn.  5. Depression: Continue Remeron (may be a component of dementia as well)  6. Dementia. Aspiration and fall precaution.  * Hypotension. Given NS bolus x 2, levophed drip prn.   All the records are reviewed and case discussed with Care Management/Social Workerr. Management plans discussed with the patient, family and they are in agreement.  CODE STATUS: DNR  TOTAL CRITICAL TIME TAKING CARE OF THIS PATIENT: 56 minutes.  Greater than 50% time was spent on coordination of care and face-to-face counseling.  POSSIBLE D/C IN 3 DAYS, DEPENDING ON CLINICAL CONDITION.   Shaune Pollack M.D on 02/28/2015 at 1:41 PM  Between 7am to 6pm - Pager - [phone]  After 6pm go to [email or web address] - password [email or web address] [facility]  [facility]  Office  [phone]  CC: Primary care physician; Keane Police, MD

## 2015-02-28 NOTE — Progress Notes (Signed)
   Subjective: 1 Day Post-Op Procedure(s) (LRB): TOTAL HIP ARTHROPLASTY ANTERIOR APPROACH (Left) Patient reports pain as mild.   Patient is well, and has had no acute complaints or problems Denies any CP, SOB, ABD pain. We will continue therapy today.   Objective: Vital signs in last 24 hours: Temp:  [97.8 F (36.6 C)-99.9 F (37.7 C)] 99.9 F (37.7 C) (02/16 0421) Pulse Rate:  [66-102] 98 (02/16 0421) Resp:  [10-22] 18 (02/16 0421) BP: (96-143)/(42-83) 123/57 mmHg (02/16 0421) SpO2:  [92 %-100 %] 97 % (02/16 0421) Weight:  [60.011 kg (132 lb 4.8 oz)] 60.011 kg (132 lb 4.8 oz) (02/16 0500)  Intake/Output from previous day: 02/15 0701 - 02/16 0700 In: 3481.7 [P.O.:400; I.V.:2981.7; IV Piggyback:100] Out: 1900 [Urine:1650; Blood:250] Intake/Output this shift:     Recent Labs  02/27/15 0335 02/28/15 0554  HGB 9.7* 9.0*    Recent Labs  02/27/15 0335 02/28/15 0554  WBC 12.7* 7.7  RBC 3.17* 2.82*  HCT 29.3* 26.2*  PLT 308 262    Recent Labs  02/27/15 0335 02/28/15 0554  NA 135 137  K 3.8 4.0  CL 104 109  CO2 23 22  BUN 34* 21*  CREATININE 1.09 0.99  GLUCOSE 110* 104*  CALCIUM 8.7* 8.1*   No results for input(s): LABPT, INR in the last 72 hours.  EXAM General - Patient is Alert, Appropriate and Oriented Extremity - Neurovascular intact Sensation intact distally Intact pulses distally Dorsiflexion/Plantar flexion intact Dressing - dressing C/D/I and no drainage Motor Function - intact, moving foot and toes well on exam.   Past Medical History  Diagnosis Date  . Dementia   . Dysphagia   . Insomnia   . Failure to thrive in adult   . Emphysema of lung ([facility])   . Major depressive disorder ([facility])   . Dementia     Assessment/Plan:   1 Day Post-Op Procedure(s) (LRB): TOTAL HIP ARTHROPLASTY ANTERIOR APPROACH (Left) Active Problems:   Closed left hip fracture (HCC)  Estimated body mass index is 21.36 kg/(m^2) as calculated from the following:  Height as of this encounter:  (1.676 m).   Weight as of this encounter: 60.011 kg (132 lb 4.8 oz). Advance diet Up with therapy  Needs BM Recheck labs in the am  DVT Prophylaxis - Lovenox, Foot Pumps and TED hose Weight-Bearing as tolerated to left leg D/C O2 and Pulse OX and try on Room Air  T. Cranston Neighbor, PA-C [facility] 02/28/2015, 7:06 AM

## 2015-02-28 NOTE — Progress Notes (Signed)
Physical Therapy Treatment Patient Details Name: James Mcguire MRN: [id number] DOB: 1932-05-27 Today's Date: 02/28/2015    History of Present Illness Pt is an 82 y.o. male s/p fall (pt LTC resident at [facility]) sustaining L femoral neck hip fx and also imaging showing L1 compression fx of indeterminate age.  Pt s/p L THA anterior approach 02/27/15.  Per MD Rosita Kea note, L1 compression fx treatment depending on how pt does with therapy (level of pain).  PMH includes dementia.    PT Comments    Pt is making limited progress towards goals, however does have slightly improved BP this session and is agreeable to try OOB mobility. Assist required for sitting at EOB, and secondary to severe pain, unable to tolerate more than 10-15 seconds before uncontrolled descent back to bed. Able to participate in some there-ex, however does need assist for L LE performance. All mobility performed on 2L of O2 with sats WNL.  Follow Up Recommendations  Home health PT;Supervision/Assistance - 24 hour     Equipment Recommendations       Recommendations for Other Services       Precautions / Restrictions Precautions Precautions: Fall;Anterior Hip;Back Precaution Comments: L1 compression fx spinal precautions.  Per orders:  Direct anterior: no hip precautions on operative hip Restrictions Weight Bearing Restrictions: Yes LLE Weight Bearing: Weight bearing as tolerated    Mobility  Bed Mobility Overal bed mobility: Needs Assistance Bed Mobility: Supine to Sit Rolling: Max assist   Supine to sit: Max assist     General bed mobility comments: max assist given for bed mobility, needs heavy assist for sliding B LE off bed. Once seated at EOB, pt complains of increased pain and has uncontrolled descent back into bed. Refuses further attempts at mobility.  Transfers                 General transfer comment: refused at this time  Ambulation/Gait             General Gait Details:  Deferred d/t low BP   Stairs            Wheelchair Mobility    Modified Rankin (Stroke Patients Only)       Balance                                    Cognition Arousal/Alertness: Awake/alert Behavior During Therapy: WFL for tasks assessed/performed Overall Cognitive Status: No family/caregiver present to determine baseline cognitive functioning       Memory: Decreased recall of precautions              Exercises Other Exercises Other Exercises: Supine ther-ex performed including B LE ankle pumps, SLRs, and hip abd/add. Pt only needs cga for performance on R LE and max assist on L LE.     General Comments        Pertinent Vitals/Pain Pain Assessment: Faces Pain Score: 2  Faces Pain Scale: Hurts whole lot Pain Location: ant.hip Pain Descriptors / Indicators: Operative site guarding Pain Intervention(s): Limited activity within patient's tolerance    Home Living Family/patient expects to be discharged to:: Skilled nursing facility               Additional Comments: Pt is a long term resident at [facility]    Prior Function Level of Independence: Needs assistance      Comments: Used walker and wheel chair  per chart review.   PT Goals (current goals can now be found in the care plan section) Acute Rehab PT Goals Patient Stated Goal: to be more independent. PT Goal Formulation: With patient Time For Goal Achievement: 03/14/15 Potential to Achieve Goals: Good Progress towards PT goals: Progressing toward goals    Frequency  BID    PT Plan Current plan remains appropriate    Co-evaluation             End of Session Equipment Utilized During Treatment: Oxygen Activity Tolerance: Patient limited by pain Patient left: in bed;with call bell/phone within reach;with bed alarm set     Time: 1330-1353 PT Time Calculation (min) (ACUTE ONLY): 23 min  Charges:  $Therapeutic Exercise: 8-22 mins $Therapeutic Activity:  8-22 mins                    G Codes:      Adelaine Roppolo 03/29/15, 2:48 PM  Elizabeth Palau, PT, DPT [phone]

## 2015-02-28 NOTE — Progress Notes (Signed)
rn spoke to dr Imogene Burn  Re: low bp continually. . Pt has received  2 bolus  So far. Last bp 94/50. Bladder scan <150. No u/o since 4am. md reports he will add another  Bolus . rn spoke to md re: concerns for pt and requested md come see pt. Pt also needs speech eval for dysphagia. md acknowledged

## 2015-02-28 NOTE — Transfer of Care (Signed)
Immediate Anesthesia Transfer of Care Note  Patient: James Mcguire  Procedure(s) Performed: Procedure(s): TOTAL HIP ARTHROPLASTY ANTERIOR APPROACH (Left)  Patient Location: PACU  Anesthesia Type:General  Level of Consciousness: sedated  Airway & Oxygen Therapy: Patient Spontanous Breathing and Patient connected to face mask oxygen  Post-op Assessment: Report given to RN  Post vital signs: Reviewed and stable  Last Vitals:  Filed Vitals:   02/28/15 0932 02/28/15 1035  BP: 87/57 88/48  Pulse: 86 96  Temp:  36.8 C  Resp:  17    Complications: No apparent anesthesia complications

## 2015-02-28 NOTE — Evaluation (Signed)
Physical Therapy Evaluation Patient Details Name: James Mcguire MRN: [id number] DOB: 1932/02/03 Today's Date: 02/28/2015   History of Present Illness  Pt is an 82 y.o. male s/p fall (pt LTC resident at [facility]) sustaining L femoral neck hip fx and also imaging showing L1 compression fx of indeterminate age.  Pt s/p L THA anterior approach 02/27/15.  Per MD Rosita Kea note, L1 compression fx treatment depending on how pt does with therapy (level of pain).  PMH includes dementia.  Clinical Impression  Prior to admission, per SW notes pt was using walker and w/c for ambulation.  Pt appears to be a poor historian and unable to give further details of PLOF but very agreeable to therapy.  Pt lives at [facility] (LTC resident).  Currently pt is max assist for logrolling in bed and able to perform LE bed level ex's with assist and cueing.  OOB mobility deferred d/t BP 88/48 (nursing reports BP has been low all morning).  Pt c/o L hip pain but no c/o low back pain during session with limited mobility.  Pt would benefit from skilled PT to address noted impairments and functional limitations.  Recommend pt discharge back to LTC facility with HHPT when medically appropriate.     Follow Up Recommendations Home health PT;Supervision/Assistance - 24 hour    Equipment Recommendations       Recommendations for Other Services       Precautions / Restrictions Precautions Precautions: Fall;Anterior Hip;Back Precaution Comments: L1 compression fx spinal precautions.  Per orders:  Direct anterior: no hip precautions on operative hip Restrictions Weight Bearing Restrictions: Yes LLE Weight Bearing: Weight bearing as tolerated      Mobility  Bed Mobility Overal bed mobility: Needs Assistance Bed Mobility: Rolling Rolling: Max assist         General bed mobility comments: Assist and cueing required to logroll to R; pt only able to minimally logroll to L d/t c/o L hip pain and  guarding  Transfers                 General transfer comment: Deferred d/t low BP  Ambulation/Gait             General Gait Details: Deferred d/t low BP  Stairs            Wheelchair Mobility    Modified Rankin (Stroke Patients Only)       Balance                                             Pertinent Vitals/Pain Pain Assessment: 0-10 Pain Score: 5  Pain Location: L hip (and initially feet but nursing took off foot SCD's and pt reporting improved comfort) Pain Descriptors / Indicators: Discomfort;Grimacing;Aching;Operative site guarding Pain Intervention(s): Limited activity within patient's tolerance;Monitored during session;Repositioned;RN gave pain meds during session;Ice applied  See flowsheet for details.    Home Living Family/patient expects to be discharged to:: Skilled nursing facility                 Additional Comments: Pt is a long term resident at [facility]    Prior Function Level of Independence: Needs assistance         Comments: Pt appearing to be poor historian on prior level of function but per SW notes, pt used a walker and w/c for ambulation.  Hand Dominance        Extremity/Trunk Assessment   Upper Extremity Assessment: Defer to OT evaluation           Lower Extremity Assessment: Generalized weakness;RLE deficits/detail;LLE deficits/detail RLE Deficits / Details: R DF, knee flexion/extension, and hip flexion at least 3/5 with AROM in bed; DF to neutral LLE Deficits / Details: L DF at least 3/5 with AROM in bed; L knee flexion/extension and hip flexion at least 2+/5 (limited AROM d/t c/o pain); DF to neutral  Cervical / Trunk Assessment: Kyphotic  Communication   Communication: No difficulties  Cognition Arousal/Alertness: Awake/alert Behavior During Therapy: WFL for tasks assessed/performed Overall Cognitive Status: No family/caregiver present to determine baseline cognitive  functioning (Oriented to person, place, time, and situation but appeared confused at other times)       Memory: Decreased recall of precautions              General Comments   Nursing cleared pt for participation in physical therapy.  Pt agreeable to PT session.    Exercises   Performed semi-supine B LE therapeutic exercise x 10 reps:  Ankle pumps (AROM B LE's); SAQ's (AROM R; AAROM L); heelslides (AAROM R; AAROM L), hip abd/adduction (AAROM R; AAROM L).  Pt required vc's and tactile cues for correct technique with exercises.       Assessment/Plan    PT Assessment Patient needs continued PT services  PT Diagnosis Difficulty walking;Generalized weakness;Acute pain   PT Problem List Decreased strength;Decreased activity tolerance;Decreased balance;Decreased mobility;Decreased knowledge of use of DME;Decreased knowledge of precautions;Pain  PT Treatment Interventions DME instruction;Gait training;Functional mobility training;Therapeutic activities;Therapeutic exercise;Balance training;Patient/family education;Wheelchair mobility training   PT Goals (Current goals can be found in the Care Plan section) Acute Rehab PT Goals Patient Stated Goal: to get OOB and move PT Goal Formulation: With patient Time For Goal Achievement: 03/14/15 Potential to Achieve Goals: Good    Frequency BID   Barriers to discharge   None to long term care facility    Co-evaluation               End of Session Equipment Utilized During Treatment: Oxygen Activity Tolerance: Patient limited by pain Patient left: in bed;with call bell/phone within reach;with bed alarm set (B heels elevated via towel rolls) Nurse Communication:  (continued low BP)         Time: 1020-1045 PT Time Calculation (min) (ACUTE ONLY): 25 min   Charges:   PT Evaluation $PT Eval Moderate Complexity: 1 Procedure PT Treatments $Therapeutic Exercise: 8-22 mins   PT G CodesHendricks Limes 13-Mar-2015,  11:02 AM Hendricks Limes, PT [phone]

## 2015-02-28 NOTE — Progress Notes (Signed)
Dr.Chen here to see patient.

## 2015-02-28 NOTE — Progress Notes (Signed)
Spoke with Dr.Chen regarding pt blood pressure of 88/48 post iv fluid bolus. Order received to give another bolus of normal saline 500 ml over 1 hr.

## 2015-02-28 NOTE — Anesthesia Procedure Notes (Signed)
Procedure Name: Intubation Date/Time: 02/27/2015 4:45 PM Performed by: Junious Silk Pre-anesthesia Checklist: Patient identified, Patient being monitored, Timeout performed, Emergency Drugs available and Suction available Patient Re-evaluated:Patient Re-evaluated prior to inductionOxygen Delivery Method: Circle system utilized Preoxygenation: Pre-oxygenation with 100% oxygen Intubation Type: IV induction Ventilation: Mask ventilation without difficulty Laryngoscope Size: Mac and 3 Grade View: Grade I Tube type: Oral Tube size: 7.0 mm Number of attempts: 1 Airway Equipment and Method: Stylet Placement Confirmation: ETT inserted through vocal cords under direct vision,  positive ETCO2 and breath sounds checked- equal and bilateral Secured at: 21 cm Tube secured with: Tape Dental Injury: Teeth and Oropharynx as per pre-operative assessment

## 2015-02-28 NOTE — Progress Notes (Signed)
Spoke with Dr.Chen regarding pt's low blood pressure 70/36, 78/30.  Order received to given 500 ml ns bolus over 1 hr.

## 2015-02-28 NOTE — Progress Notes (Signed)
Patient is a long term care resident at [facility] and will return there when medically stable for D/C. Clinical Social Worker (CSW) will continue to follow and assist as needed.   Jetta Lout, LCSW [phone]

## 2015-02-28 NOTE — Evaluation (Signed)
Occupational Therapy Evaluation Patient Details Name: James Mcguire MRN: [id number] DOB: 1932-04-02 Today's Date: 02/28/2015    History of Present Illness This patient is an 82 year old male s/p fall (pt LTC resident at [facility]) sustaining L femoral neck hip fx and also imaging showing L1 compression fx of indeterminate age.  Pt s/p L THA anterior approach 02/27/15.  Per MD Rudene Christians note, L1 compression fx treatment depending on how pt does with therapy (level of pain).  PMH includes dementia.   Clinical Impression   This patient is an year old male who came to [facility] after a fall suffering a left  hip fracture. He received a hemiarthroplasty repair (anterior approach). He lives at [facility]. Unsure of previous functional level. He has deficits in pain, mobility and activities of daily living and would benefit from Occupational Therapy for ADL/functional mobility training while staying within hip precautions and upper extremity strengthening to be able to push down on the walker. Note, patient did not get out of bed secondary to low blood pressure.    Follow Up Recommendations  SNF    Equipment Recommendations       Recommendations for Other Services       Precautions / Restrictions Precautions Precautions: Fall;Anterior Hip;Back Precaution Comments: L1 compression fx spinal precautions.  Per orders:  Direct anterior: no hip precautions on operative hip Restrictions Weight Bearing Restrictions: Yes LLE Weight Bearing: Weight bearing as tolerated      Mobility           Transfers                 General transfer comment: Deferred secondary to low BP.    Balance                                            ADL                                         General ADL Comments: Unable to determine prior status, patient reports he could dress himself.  Practiced techniques for lower body dressing  using hip kit. Patient needed hand over hand assist verbal cues and physical cues for technique and safety.      Vision     Perception     Praxis      Pertinent Vitals/Pain Pain Assessment: 0-10 Pain Score: 2  Pain Location: L hip Pain Descriptors / Indicators: Discomfort;Grimacing;Aching;Operative site guarding Pain Intervention(s): Limited activity within patient's tolerance;Monitored during session;Repositioned;RN gave pain meds during session;Ice applied     Hand Dominance     Extremity/Trunk Assessment Upper Extremity Assessment Upper Extremity Assessment:  (R UE shoulder flexion to 100o L shoulder to 45o distal motions WFL. elbow strength 4/5.)   Lower Extremity Assessment Lower Extremity Assessment: Defer to PT evaluation    Cervical / Trunk Assessment Cervical / Trunk Assessment: Kyphotic   Communication Communication Communication: No difficulties (but did fall asleep several times.)   Cognition Arousal/Alertness: Lethargic Behavior During Therapy: WFL for tasks assessed/performed Overall Cognitive Status: No family/caregiver present to determine baseline cognitive functioning (Oriented to person, place, time, and situation but appeared confused at other times)       Memory: Decreased recall of precautions  General Comments       Exercises       Shoulder Instructions      Home Living Family/patient expects to be discharged to:: Skilled nursing facility                                 Additional Comments: Pt is a long term resident at [facility]      Prior Functioning/Environment Level of Independence: Needs assistance        Comments: Used walker and wheel chair per chart review.    OT Diagnosis: Generalized weakness;Acute pain   OT Problem List: Decreased strength;Decreased range of motion;Decreased activity tolerance;Impaired balance (sitting and/or standing);Decreased knowledge of use of DME or  AE;Decreased knowledge of precautions;Pain;Impaired UE functional use   OT Treatment/Interventions: Self-care/ADL training;Therapeutic exercise    OT Goals(Current goals can be found in the care plan section) Acute Rehab OT Goals Patient Stated Goal: to be more independent. OT Goal Formulation: With patient Time For Goal Achievement: 03/14/15 Potential to Achieve Goals: Good  OT Frequency: Min 1X/week   Barriers to D/C:            Co-evaluation              End of Session Equipment Utilized During Treatment:  (hip kit)  Activity Tolerance: Patient limited by lethargy Patient left:     Time: 2346-8873 OT Time Calculation (min): 17 min Charges:  OT General Charges $OT Visit: 1 Procedure OT Evaluation $OT Eval Moderate Complexity: 1 Procedure G-Codes:    Myrene Galas, MS/OTR/L  02/28/2015, 11:42 AM

## 2015-03-01 ENCOUNTER — Inpatient Hospital Stay: Payer: Medicare Other

## 2015-03-01 LAB — URINALYSIS COMPLETE WITH MICROSCOPIC (ARMC ONLY)
Bilirubin Urine: NEGATIVE
GLUCOSE, UA: NEGATIVE mg/dL
KETONES UR: NEGATIVE mg/dL
Leukocytes, UA: NEGATIVE
NITRITE: NEGATIVE
Protein, ur: 100 mg/dL — AB
SPECIFIC GRAVITY, URINE: 1.016 (ref 1.005–1.030)
Squamous Epithelial / LPF: NONE SEEN
pH: 6 (ref 5.0–8.0)

## 2015-03-01 LAB — BASIC METABOLIC PANEL
Anion gap: 6 (ref 5–15)
BUN: 18 mg/dL (ref 6–20)
CHLORIDE: 111 mmol/L (ref 101–111)
CO2: 23 mmol/L (ref 22–32)
CREATININE: 0.87 mg/dL (ref 0.61–1.24)
Calcium: 8.1 mg/dL — ABNORMAL LOW (ref 8.9–10.3)
GFR calc Af Amer: >60 mL/min (ref >60)
GFR calc non Af Amer: >60 mL/min (ref >60)
Glucose, Bld: 102 mg/dL — ABNORMAL HIGH (ref 65–99)
Potassium: 3.6 mmol/L (ref 3.5–5.1)
Sodium: 140 mmol/L (ref 135–145)

## 2015-03-01 LAB — CBC WITH DIFFERENTIAL/PLATELET
Basophils Absolute: 0 10*3/uL (ref 0–0.1)
Basophils Relative: 0 %
Eosinophils Absolute: 0.1 10*3/uL (ref 0–0.7)
HEMATOCRIT: 30.6 % — AB (ref 40.0–52.0)
Hemoglobin: 10.3 g/dL — ABNORMAL LOW (ref 13.0–18.0)
LYMPHS ABS: 1 10*3/uL (ref 1.0–3.6)
MCH: 29.7 pg (ref 26.0–34.0)
MCHC: 33.7 g/dL (ref 32.0–36.0)
MCV: 88.2 fL (ref 80.0–100.0)
MONO ABS: 1.2 10*3/uL — AB (ref 0.2–1.0)
Monocytes Relative: 13 %
Neutro Abs: 6.9 10*3/uL — ABNORMAL HIGH (ref 1.4–6.5)
Neutrophils Relative %: 75 %
PLATELETS: 263 10*3/uL (ref 150–440)
RBC: 3.47 MIL/uL — AB (ref 4.40–5.90)
RDW: 17.9 % — AB (ref 11.5–14.5)
WBC: 9.2 10*3/uL (ref 3.8–10.6)

## 2015-03-01 LAB — SURGICAL PATHOLOGY

## 2015-03-01 LAB — HEMOGLOBIN: Hemoglobin: 10.9 g/dL — ABNORMAL LOW (ref 13.0–18.0)

## 2015-03-01 MED ORDER — TRAZODONE HCL 50 MG PO TABS: 50.0000 mg | ORAL_TABLET | Freq: Every evening | ORAL | Status: AC | PRN

## 2015-03-01 MED ORDER — VITAMINS A & D EX OINT: 1.0000 "application " | TOPICAL_OINTMENT | CUTANEOUS | Status: AC | PRN

## 2015-03-01 MED ORDER — MINERAL OIL RE ENEM: 1.0000 | ENEMA | Freq: Once | RECTAL | Status: DC

## 2015-03-01 MED ORDER — HYDROCODONE-ACETAMINOPHEN 5-325 MG PO TABS: 1.0000 | ORAL_TABLET | Freq: Four times a day (QID) | ORAL | Status: DC | PRN

## 2015-03-01 MED ORDER — ENSURE ENLIVE PO LIQD: 237.0000 mL | Freq: Three times a day (TID) | ORAL | Status: DC

## 2015-03-01 NOTE — Care Management Important Message (Signed)
Important Message  Patient Details  Name: Sequoyah Counterman MRN: [id number] Date of Birth: 09-07-1932   Medicare Important Message Given:  Yes    Olegario Messier A Verl Kitson 03/01/2015, 11:07 AM

## 2015-03-01 NOTE — Progress Notes (Signed)
Chest- x-ray done, lab staff here to do draw labs, urine sent per order.

## 2015-03-01 NOTE — Discharge Instructions (Signed)
°ANTERIOR APPROACH TOTAL HIP REPLACEMENT POSTOPERATIVE DIRECTIONS ° ° °Hip Rehabilitation, Guidelines Following Surgery  °The results of a hip operation are greatly improved after range of motion and muscle strengthening exercises. Follow all safety measures which are given to protect your hip. If any of these exercises cause increased pain or swelling in your joint, decrease the amount until you are comfortable again. Then slowly increase the exercises. Call your caregiver if you have problems or questions.  ° °HOME CARE INSTRUCTIONS  °Remove items at home which could result in a fall. This includes throw rugs or furniture in walking pathways.  °· ICE to the affected hip every three hours for 30 minutes at a time and then as needed for pain and swelling.  Continue to use ice on the hip for pain and swelling from surgery. You may notice swelling that will progress down to the foot and ankle.  This is normal after surgery.  Elevate the leg when you are not up walking on it.   °· Continue to use the breathing machine which will help keep your temperature down.  It is common for your temperature to cycle up and down following surgery, especially at night when you are not up moving around and exerting yourself.  The breathing machine keeps your lungs expanded and your temperature down. °· Do not place pillow under knee, focus on keeping the knee straight while resting ° °DIET °You may resume your previous home diet once your are discharged from the hospital. ° °DRESSING / WOUND CARE / SHOWERING °Keep dressing clean and dry. Change dressing only as needed. You may shower after your first follow up appointment with [facility]. ° °ACTIVITY °Walk with your walker as instructed. °Use walker as long as suggested by your caregivers. °Avoid periods of inactivity such as sitting longer than an hour when not asleep. This helps prevent blood clots.  °You may resume a sexual relationship in one month or when given the OK  by your doctor.  °You may return to work once you are cleared by your doctor.  °Do not drive a car for 6 weeks or until released by you surgeon.  °Do not drive while taking narcotics. ° °WEIGHT BEARING °Weight bearing as tolerated. Use walker/cane as needed for at least 4 weeks post op. ° °POSTOPERATIVE CONSTIPATION PROTOCOL °Constipation - defined medically as fewer than three stools per week and severe constipation as less than one stool per week. ° °One of the most common issues patients have following surgery is constipation.  Even if you have a regular bowel pattern at home, your normal regimen is likely to be disrupted due to multiple reasons following surgery.  Combination of anesthesia, postoperative narcotics, change in appetite and fluid intake all can affect your bowels.  In order to avoid complications following surgery, here are some recommendations in order to help you during your recovery period. ° °Colace (docusate) - Pick up an over-the-counter form of Colace or another stool softener and take twice a day as long as you are requiring postoperative pain medications.  Take with a full glass of water daily.  If you experience loose stools or diarrhea, hold the colace until you stool forms back up.  If your symptoms do not get better within 1 week or if they get worse, check with your doctor. ° °Dulcolax (bisacodyl) - Pick up over-the-counter and take as directed by the product packaging as needed to assist with the movement of your bowels.  Take with a   full glass of water.  Use this product as needed if not relieved by Colace only.  ° °MiraLax (polyethylene glycol) - Pick up over-the-counter to have on hand.  MiraLax is a solution that will increase the amount of water in your bowels to assist with bowel movements.  Take as directed and can mix with a glass of water, juice, soda, coffee, or tea.  Take if you go more than two days without a movement. °Do not use MiraLax more than once per day. Call your  doctor if you are still constipated or irregular after using this medication for 7 days in a row. ° °If you continue to have problems with postoperative constipation, please contact the office for further assistance and recommendations.  If you experience "the worst abdominal pain ever" or develop nausea or vomiting, please contact the office immediatly for further recommendations for treatment. ° °ITCHING ° If you experience itching with your medications, try taking only a single pain pill, or even half a pain pill at a time.  You can also use Benadryl over the counter for itching or also to help with sleep.  ° °TED HOSE STOCKINGS °Wear the elastic stockings on both legs for six weeks following surgery during the day but you may remove then at night for sleeping. ° °MEDICATIONS °See your medication summary on the “After Visit Summary” that the nursing staff will review with you prior to discharge.  You may have some home medications which will be placed on hold until you complete the course of blood thinner medication.  It is important for you to complete the blood thinner medication as prescribed by your surgeon.  Continue your approved medications as instructed at time of discharge. ° °PRECAUTIONS °If you experience chest pain or shortness of breath - call 911 immediately for transfer to the hospital emergency department.  °If you develop a fever greater that 101 F, purulent drainage from wound, increased redness or drainage from wound, foul odor from the wound/dressing, or calf pain - CONTACT YOUR SURGEON.   °                                                °FOLLOW-UP APPOINTMENTS °Make sure you keep all of your appointments after your operation with your surgeon and caregivers. You should call the office at the above phone number and make an appointment for approximately two weeks after the date of your surgery or on the date instructed by your surgeon outlined in the "After Visit Summary". ° °RANGE OF MOTION  AND STRENGTHENING EXERCISES  °These exercises are designed to help you keep full movement of your hip joint. Follow your caregiver's or physical therapist's instructions. Perform all exercises about fifteen times, three times per day or as directed. Exercise both hips, even if you have had only one joint replacement. These exercises can be done on a training (exercise) mat, on the floor, on a table or on a bed. Use whatever works the best and is most comfortable for you. Use music or television while you are exercising so that the exercises are a pleasant break in your day. This will make your life better with the exercises acting as a break in routine you can look forward to.  °Lying on your back, slowly slide your foot toward your buttocks, raising your knee up off the floor. Then   slowly slide your foot back down until your leg is straight again.  Lying on your back spread your legs as far apart as you can without causing discomfort.  Lying on your side, raise your upper leg and foot straight up from the floor as far as is comfortable. Slowly lower the leg and repeat.  Lying on your back, tighten up the muscle in the front of your thigh (quadriceps muscles). You can do this by keeping your leg straight and trying to raise your heel off the floor. This helps strengthen the largest muscle supporting your knee.  Lying on your back, tighten up the muscles of your buttocks both with the legs straight and with the knee bent at a comfortable angle while keeping your heel on the floor.   IF YOU ARE TRANSFERRED TO A SKILLED REHAB FACILITY If the patient is transferred to a skilled rehab facility following release from the hospital, a list of the current medications will be sent to the facility for the patient to continue.  When discharged from the skilled rehab facility, please have the facility set up the patient's Home Health Physical Therapy prior to being released. Also, the skilled facility will be responsible  for providing the patient with their medications at time of release from the facility to include their pain medication, the muscle relaxants, and their blood thinner medication. If the patient is still at the rehab facility at time of the two week follow up appointment, the skilled rehab facility will also need to assist the patient in arranging follow up appointment in our office and any transportation needs.  MAKE SURE YOU:  Understand these instructions.  Get help right away if you are not doing well or get worse.    Pick up stool softner and laxative for home use following surgery while on pain medications. Continue to use ice for pain and swelling after surgery. Do not use any lotions or creams on the incision until instructed by your surgeon.  Heart healthy diet. Activity as tolerated.

## 2015-03-01 NOTE — Progress Notes (Signed)
Nutrition Follow-up  DOCUMENTATION CODES:   Severe malnutrition in context of chronic illness  INTERVENTION:   Meals and Snacks: Cater to patient preferences, pt now on Dysphagia I honey thick liquids Medical Food Supplement Therapy:  Will change supplement to Honey thick Mighty Shakes TID and Magic Cup BID and discontinue Ensure as not compliant   NUTRITION DIAGNOSIS:   Malnutrition related to chronic illness as evidenced by severe depletion of muscle mass, severe depletion of body fat.  GOAL:   Patient will meet greater than or equal to 90% of their needs  MONITOR:    (Eenrgy intake, Electrolyte and renal Profile, Anthropometrics, Digestive System)  REASON FOR ASSESSMENT:   Consult Hip fracture protocol  ASSESSMENT:   Pt admitted with closed left hip fracture, scheduled for left THA 02/27/2015.  Pt s/p surgical intervention. Pt discharge cancelled secondary to fever.  Diet Order:  DIET - DYS 1 Room service appropriate?: Yes; Fluid consistency:: Honey Thick Diet - low sodium heart healthy   Current Nutrition: Per documentation 100% of breakfast this am. Pt now s/p SLP evaluation and downgraded diet order.   Gastrointestinal Profile: Last BM: 03/01/2015   Scheduled Medications:  . sodium chloride   Intravenous Once  . acetaminophen  650 mg Oral Once  . clonazepam  0.125 mg Oral BID  . enoxaparin (LOVENOX) injection  40 mg Subcutaneous Q24H  . ferrous fumarate-b12-vitamic C-folic acid  1 capsule Oral TID PC  . ipratropium-albuterol  3 mL Inhalation TID  . mineral oil  1 enema Rectal Once  . mirtazapine  7.5 mg Oral QHS  . polyethylene glycol  17 g Oral Daily  . senna-docusate  2 tablet Oral BID  . sodium chloride flush  3 mL Intravenous Q12H  . tiotropium  18 mcg Inhalation Daily    Continuous Medications:  . sodium chloride 50 mL/hr at 03/01/15 0830     Electrolyte/Renal Profile and Glucose Profile:   Recent Labs Lab 02/27/15 0335 02/28/15 0554  03/01/15 0501  NA 135 137 140  K 3.8 4.0 3.6  CL 104 109 111  CO2 BUN 34* 21* 18  CREATININE 1.09 0.99 0.87  CALCIUM 8.7* 8.1* 8.1*  GLUCOSE 110* 104* 102*   Protein Profile:  Recent Labs Lab 02/27/15 0335  ALBUMIN 4.0     Weight Trend since Admission: Filed Weights   02/27/15 0328 02/28/15 0500 03/01/15 0500  Weight: 133 lb 8 oz (60.555 kg) 132 lb 4.8 oz (60.011 kg) 133 lb 4.3 oz (60.45 kg)     BMI:  Body mass index is 21.52 kg/(m^2).  Estimated Nutritional Needs:   Kcal:  BEE: 1253kcals, TEE: (IF 1.1-1.3)(AF 1.2) 1654-1955kcals  Protein:  67-79g protein (1.1-1.3g/kg)  Fluid:  1515-1821mL of fluid (25-27mL/kg)  EDUCATION NEEDS:   No education needs identified at this time   MODERATE Care Level  Leda Quail, RD, LDN Pager [phone] Weekend/On-Call Pager [phone]

## 2015-03-01 NOTE — Progress Notes (Addendum)
Per Dr. Imogene Burn discharge is cancelled today because patient has a fever of 102. Deborah admissions coordinator at [facility] is aware of above and reported that patient can D/C to [facility] over the weekend if medically stable. CSW will call weekend supervisor Amy Freida Busman at [facility] if patient is ready for D/C over the weenend. CSW contacted patient's daughter Misty Stanley and son in law Rosanne Ashing and made them aware of above. CSW will continue to follow and assist as needed.   Jetta Lout, LCSW [phone]

## 2015-03-01 NOTE — Progress Notes (Signed)
OT Cancellation Note  Patient Details Name: James Mcguire MRN: [id number] DOB: [birth year]   Cancelled Treatment:    Reason Eval/Treat Not Completed: Patient declined, no reason specified. Patient refused.  Ocie Cornfield 03/01/2015, 12:22 PM

## 2015-03-01 NOTE — Progress Notes (Addendum)
[facility] [place] at [facility]   PATIENT NAME: James Mcguire    MR#:  [id number]  DATE OF BIRTH:  08-Apr-1932  SUBJECTIVE:  CHIEF COMPLAINT:   Chief Complaint  Patient presents with  . Fall  No complaint this am.  T 102.8 and HR 116 this pm per RN  REVIEW OF SYSTEMS:  CONSTITUTIONAL: Has fever, has weakness.  EYES: No blurred or double vision.  EARS, NOSE, AND THROAT: No tinnitus or ear pain.  RESPIRATORY: No cough, shortness of breath, wheezing or hemoptysis.  CARDIOVASCULAR: No chest pain, orthopnea, edema.  GASTROINTESTINAL: No nausea, vomiting, diarrhea or abdominal pain.  GENITOURINARY: No dysuria, hematuria.  ENDOCRINE: No polyuria, nocturia,  HEMATOLOGY: No anemia, easy bruising or bleeding SKIN: No rash or lesion. MUSCULOSKELETAL: left hip pain. NEUROLOGIC: No tingling, numbness, weakness.  PSYCHIATRY: No anxiety or depression.   DRUG ALLERGIES:  No Known Allergies  VITALS:  Blood pressure 155/79, pulse 116, temperature 100.6 F (38.1 C), temperature source Oral, resp. rate 20, height  (1.676 m), weight 60.45 kg (133 lb 4.3 oz), SpO2 98 %.  PHYSICAL EXAMINATION:  GENERAL:  82 y.o.-year-old patient lying in the bed NAD.  EYES: Pupils equal, round, reactive to light and accommodation. No scleral icterus. Extraocular muscles intact. Pale conjunctiva. HEENT: Head atraumatic, normocephalic. Oropharynx and nasopharynx clear.  NECK:  Supple, no jugular venous distention. No thyroid enlargement, no tenderness.  LUNGS: Normal breath sounds bilaterally, no wheezing, rales,rhonchi or crepitation. No use of accessory muscles of respiration.  CARDIOVASCULAR: S1, S2 normal. No murmurs, rubs, or gallops.  ABDOMEN: Soft, nontender, nondistended. Bowel sounds present. No organomegaly or mass.  EXTREMITIES: No pedal edema, cyanosis, or clubbing.  NEUROLOGIC: Cranial nerves II through XII are intact. Unable to exam Muscle strength due to  fracture. PSYCHIATRIC: The patient is alert and oriented x 2, demented.  SKIN: No obvious rash, lesion, or ulcer.    LABORATORY PANEL:   CBC  Recent Labs Lab 02/28/15 1301 03/01/15 0501  WBC 7.9  --   HGB 7.7* 10.9*  HCT 22.9*  --   PLT 230  --    ------------------------------------------------------------------------------------------------------------------  Chemistries   Recent Labs Lab 02/27/15 0335  03/01/15 0501  NA 135  < > 140  K 3.8  < > 3.6  CL 104  < > 111  CO2 23  < > 23  GLUCOSE 110*  < > 102*  BUN 34*  < > 18  CREATININE 1.09  < > 0.87  CALCIUM 8.7*  < > 8.1*  AST 25  --   --   ALT 17  --   --   ALKPHOS 60  --   --   BILITOT 0.7  --   --   < > = values in this interval not displayed. ------------------------------------------------------------------------------------------------------------------  Cardiac Enzymes  Recent Labs Lab 02/27/15 0335  TROPONINI 0.04*   ------------------------------------------------------------------------------------------------------------------  RADIOLOGY:  Dg Chest 1 View  03/01/2015  CLINICAL DATA:  Fever and tachycardia EXAM: CHEST 1 VIEW COMPARISON:  February 27, 2015 FINDINGS: The patient's hand overlaps the lateral left hemithorax. There is mild scarring in the left base. There is no edema or consolidation. Heart is upper normal in size with pulmonary vascularity within normal limits. No adenopathy. There is evidence of old rib trauma with remodeling bilaterally. Bones are osteoporotic. IMPRESSION: Slight scarring left base. No edema or consolidation. No change in cardiac silhouette. Electronically Signed   By: Bretta Bang III M.D.  On: 03/01/2015 15:22   Dg Hip Operative Unilat W Or W/o Pelvis Left  02/27/2015  CLINICAL DATA:  Hip replacement EXAM: OPERATIVE left HIP (WITH PELVIS IF PERFORMED) 3 VIEWS TECHNIQUE: Fluoroscopic spot image(s) were submitted for interpretation post-operatively. COMPARISON:   X-rays of earlier the same day. FINDINGS: Three intraoperative spot fluoro films are included on this study. There is no laterality marker on any of the images. The first image is of a hip joint showing femoral neck fracture. Given that the left femoral neck was fractured on the films performed earlier the same day, the working assumption for this exam is that it is of the left hip. The next image shows left hip prosthesis with sizing component. Final image again shows hip replacement hardware although the femoral component has been incompletely visualized. IMPRESSION: Intraoperative assessment during left hip replacement. Incomplete visualization of the femoral stem. Electronically Signed   By: Kennith Center M.D.   On: 02/27/2015 18:01   Dg Hip Unilat W Or W/o Pelvis 2-3 Views Left  02/27/2015  CLINICAL DATA:  82 year old male status post left hip replacement EXAM: DG HIP (WITH OR WITHOUT PELVIS) 2-3V LEFT COMPARISON:  Preoperative radiographs 02/27/2015 at 3:55 a.m. FINDINGS: Interval surgical changes of left hip arthroplasty without evidence of immediate hardware complication. The visualized bony pelvis is intact. Atherosclerotic calcifications present in the superficial femoral artery. IMPRESSION: Postoperative changes of left hip arthroplasty without evidence of immediate complication. Atherosclerotic vascular calcifications. Electronically Signed   By: Malachy Moan M.D.   On: 02/27/2015 18:54    EKG:   Orders placed or performed during the hospital encounter of 10/05/14  . EKG 12-Lead  . EKG 12-Lead  . EKG    ASSESSMENT AND PLAN:   1. Left hip fracture:  Hip surgery POD2. Pain control, DVT prophylaxis..  2. Dehydration. Improved with IVF.Marland Kitchen   3. Anemia of chronic disease plus acute blood loss.  S/p PRBC transfusion 2 units, Hb 10.9.  4. COPD with emphasema. Stable. NEB prn. Need O2 [place] 2L.  5. Depression: Continue Remeron (may be a component of dementia as well)  6. Dementia.  Aspiration and fall precaution.  * Hypotension. Given NS bolus x 2, improved.  Fever. T102.8.  Tachycardia at 116.  Cancel discharge. Check CBC, B/C, UA, CXR.   All the records are reviewed and case discussed with Care Management/Social Workerr. Management plans discussed with the patient, family and they are in agreement.  CODE STATUS: DNR  TOTAL TIME TAKING CARE OF THIS PATIENT: 47 minutes.  Greater than 50% time was spent on coordination of care and face-to-face counseling.  POSSIBLE D/C IN 2 DAYS, DEPENDING ON CLINICAL CONDITION.   Shaune Pollack M.D on 03/01/2015 at 3:51 PM  Between 7am to 6pm - Pager - [phone]  After 6pm go to [email or web address] - password [email or web address] [facility]  [place] [facility]  Office  [phone]  CC: Primary care physician; Keane Police, MD

## 2015-03-01 NOTE — Progress Notes (Signed)
MD said to discontinue telemetry 

## 2015-03-01 NOTE — Progress Notes (Signed)
Subjective: 2 Days Post-Op Procedure(s) (LRB): TOTAL HIP ARTHROPLASTY ANTERIOR APPROACH (Left) Patient reports pain as mild.   Patient is well but recently received 2 units of PRBC PT recommending home-health PT Negative for chest pain and shortness of breath Fever: no Gastrointestinal:Negative for nausea and vomiting Pt using nebulizer this morning during exam.  Objective: Vital signs in last 24 hours: Temp:  [98 F (36.7 C)-99.1 F (37.3 C)] 98.4 F (36.9 C) (02/17 0357) Pulse Rate:  [68-102] 94 (02/17 0357) Resp:  [17-30] 30 (02/17 0357) BP: (70-155)/(30-67) 155/60 mmHg (02/17 0357) SpO2:  [88 %-100 %] 99 % (02/17 0357) Weight:  [60.45 kg (133 lb 4.3 oz)] 60.45 kg (133 lb 4.3 oz) (02/17 0500)  Intake/Output from previous day:  Intake/Output Summary (Last 24 hours) at 03/01/15 0740 Last data filed at 03/01/15 0430  Gross per 24 hour  Intake 3372.67 ml  Output    625 ml  Net 2747.67 ml    Intake/Output this shift:    Labs:  Recent Labs  02/27/15 0335 02/28/15 0554 02/28/15 1301 03/01/15 0501  HGB 9.7* 9.0* 7.7* 10.9*    Recent Labs  02/28/15 0554 02/28/15 1301  WBC 7.7 7.9  RBC 2.82* 2.43*  HCT 26.2* 22.9*  PLT 262 230    Recent Labs  02/28/15 0554 03/01/15 0501  NA 137 140  K 4.0 3.6  CL 109 111  CO2 22 23  BUN 21* 18  CREATININE 0.99 0.87  GLUCOSE 104* 102*  CALCIUM 8.1* 8.1*   No results for input(s): LABPT, INR in the last 72 hours.   EXAM General - Patient is Alert and Appropriate Extremity - ABD soft Neurovascular intact Sensation intact distally Intact pulses distally Dorsiflexion/Plantar flexion intact Incision: moderate drainage Dressing/Incision - Bulky dressing with moderate bloody drainage removed this AM.  Honeycomb dressing applied. Motor Function - intact, moving foot and toes well on exam.   Past Medical History  Diagnosis Date  . Dementia   . Dysphagia   . Insomnia   . Failure to thrive in adult   . Emphysema  of lung (HCC)   . Major depressive disorder (HCC)   . Dementia     Assessment/Plan: 2 Days Post-Op Procedure(s) (LRB): TOTAL HIP ARTHROPLASTY ANTERIOR APPROACH (Left) Active Problems:   Closed left hip fracture (HCC)   Protein-calorie malnutrition, severe  Estimated body mass index is 21.52 kg/(m^2) as calculated from the following:   Height as of this encounter:  (1.676 m).   Weight as of this encounter: 60.45 kg (133 lb 4.3 oz). Advance diet Up with therapy   Pt received 2 units of PRBC.  Hg up to 10.9 this AM. Labs reviewed.  Repeat CBC and BMP tomorrow. Pt reports that he has had a BM. Upon discharge, continue Lovenox  q 24 hours x 14 days for DVT prophylaxis.  Dressing changes as needed.  DVT Prophylaxis - Lovenox, Foot Pumps and TED hose Weight-Bearing as tolerated to left leg  J. Horris Latino, PA-C [facility] 03/01/2015, 7:40 AM

## 2015-03-01 NOTE — Plan of Care (Signed)
Problem: SLP Dysphagia Goals Goal: Misc Dysphagia Goal Pt will safely tolerate po diet of least restrictive consistency w/ no overt s/s of aspiration noted by Staff/pt/family x3 sessions.   Comments:  Pt will safely tolerate po diet of least restrictive consistency w/ no overt s/s of aspiration noted by Staff/pt/family x3 sessions.

## 2015-03-01 NOTE — Progress Notes (Signed)
Patient is medically stable for D/C back to [facility] today. Per Gavin Pound admissions coordinator at [facility] patient will go to room 331-A. RN will call report to A-wing nurses station and arrange EMS for transport. Clinical Child psychotherapist (CSW) sent D/C Summary, FL2 and D/C Packet to [employer] via [employer] today. Patient's daughter James Mcguire and son in law James Mcguire are at bedside and aware of D/C today. Please reconsult if future social work needs arise. CSW signing off.   Jetta Lout, LCSW [phone]

## 2015-03-01 NOTE — Progress Notes (Signed)
Physical Therapy Treatment Patient Details Name: James Mcguire MRN: [id number] DOB: [birth year] Today's Date: 03/01/2015    History of Present Illness Pt is an 82 y.o. male s/p fall (pt LTC resident at [facility]) sustaining L femoral neck hip fx and also imaging showing L1 compression fx of indeterminate age.  Pt s/p L THA anterior approach 02/27/15.  Per MD Rosita Kea note, L1 compression fx treatment depending on how pt does with therapy (level of pain).  PMH includes dementia.    PT Comments    Pt is making limited progress towards goals with improved functional mobility, able to transfer to recliner. Pt only able to follow gross movement commands and has difficulty attending to exercise performance tasks. O2 donned for all mobility. Pt continues to be limited by pain/fatigue at this time. Needs +2 assist for all mobility.  Follow Up Recommendations  Home health PT;Supervision/Assistance - 24 hour     Equipment Recommendations       Recommendations for Other Services       Precautions / Restrictions Precautions Precautions: Fall;Anterior Hip;Back Precaution Comments: L1 compression fx spinal precautions.  Per orders:  Direct anterior: no hip precautions on operative hip Restrictions Weight Bearing Restrictions: Yes LLE Weight Bearing: Weight bearing as tolerated    Mobility  Bed Mobility Overal bed mobility: +2 for physical assistance;Needs Assistance Bed Mobility: Supine to Sit Rolling: Max assist         General bed mobility comments: assist need to scoot out towards EOB and move B LE. Pt able to initiate trunk mobility and use hand rail to sit at EOB. Once seated, pt able to sit with cga  Transfers Overall transfer level: Needs assistance Equipment used: None Transfers: Sit to/from Stand Sit to Stand: Max assist;+2 physical assistance         General transfer comment: Stand pivot transfer to recliner with max assist + 2. Pt able to weight bear through B LE,  however unable to follow directions for use of AD. Pt stands with forward flexed posture  Ambulation/Gait             General Gait Details: unable at this time   Stairs            Wheelchair Mobility    Modified Rankin (Stroke Patients Only)       Balance                                    Cognition Arousal/Alertness: Lethargic Behavior During Therapy: WFL for tasks assessed/performed Overall Cognitive Status: Within Functional Limits for tasks assessed                      Exercises Other Exercises Other Exercises: supine ther-ex performed however pt unable to follow commands for performance. R LE SLR and hip abd/add attempted about 5-6 times with max assist. Pt also fatigued and has difficulty attending to task.    General Comments        Pertinent Vitals/Pain Pain Assessment: 0-10 Pain Score: 8  Pain Location: L LE Pain Descriptors / Indicators: Operative site guarding;Discomfort Pain Intervention(s): Limited activity within patient's tolerance;Premedicated before session;Ice applied    Home Living                      Prior Function            PT Goals (current goals  can now be found in the care plan section) Acute Rehab PT Goals Patient Stated Goal: to be more independent. PT Goal Formulation: With patient Time For Goal Achievement: 03/14/15 Potential to Achieve Goals: Good Progress towards PT goals: Progressing toward goals    Frequency  BID    PT Plan Current plan remains appropriate    Co-evaluation             End of Session Equipment Utilized During Treatment: Oxygen;Gait belt Activity Tolerance: Patient limited by pain;Patient limited by fatigue Patient left: in chair;with chair alarm set;with family/visitor present;with SCD's reapplied     Time: 1030-1045 PT Time Calculation (min) (ACUTE ONLY): 15 min  Charges:  $Therapeutic Activity: 8-22 mins                    G Codes:       Aqueelah Cotrell 2015-03-28, 11:39 AM  Elizabeth Palau, PT, DPT [phone]

## 2015-03-01 NOTE — Discharge Summary (Signed)
[facility] - [place] at [facility]   PATIENT NAME: James Mcguire    MR#:  [id number]  DATE OF BIRTH:  [birth year]  DATE OF ADMISSION:  02/27/2015 ADMITTING PHYSICIAN: Arnaldo Natal, MD  DATE OF DISCHARGE: 03/01/2015 PRIMARY CARE PHYSICIAN: Keane Police, MD    ADMISSION DIAGNOSIS:  Compression fracture [T14.8] Closed wedge compression fracture of first lumbar vertebra, initial encounter (HCC) [S32.010A] Closed left hip fracture, initial encounter (HCC) [S72.002A]   DISCHARGE DIAGNOSIS:  Left hip fracture Anemia of chronic disease plus acute blood loss. Hypotension SECONDARY DIAGNOSIS:   Past Medical History  Diagnosis Date  . Dementia   . Dysphagia   . Insomnia   . Failure to thrive in adult   . Emphysema of lung (HCC)   . Major depressive disorder (HCC)   . Dementia     HOSPITAL COURSE:  1. Left hip fracture:  Hip surgery POD2. Pain control, DVT prophylaxis..  2. Dehydration. Improved with IVF.Marland Kitchen   3. Anemia of chronic disease plus acute blood loss.  S/p PRBC transfusion 2 units, Hb 10.9.  4. COPD with emphasema. Stable. NEB prn. Need O2 Shorter 2L.  5. Depression: Continue Remeron (may be a component of dementia as well)  6. Dementia. Aspiration and fall precaution.  * Hypotension. Given NS bolus x 2, improved.  DISCHARGE CONDITIONS:   Stable, discharge to SNF today.  CONSULTS OBTAINED:  Treatment Team:  Kennedy Bucker, MD  DRUG ALLERGIES:  No Known Allergies  DISCHARGE MEDICATIONS:   Current Discharge Medication List    CONTINUE these medications which have CHANGED   Details  HYDROcodone-acetaminophen (NORCO/VICODIN) 5-325 MG tablet Take 1 tablet by mouth every 6 (six) hours as needed for moderate pain. Qty: 16 tablet, Refills: 0    traZODone (DESYREL) 50 MG tablet Take 1 tablet (50 mg total) by mouth at bedtime as needed for sleep. Reported on 02/27/2015 Qty: 30 tablet, Refills: 0    Vitamins A & D  (VITAMIN A & D) ointment Apply 1 application topically as needed (moisture barrier). Apply with Calmoseptine Qty: 45 g, Refills: 0      CONTINUE these medications which have NOT CHANGED   Details  acetaminophen (TYLENOL) 500 MG tablet Take 500 mg by mouth every 4 (four) hours as needed for moderate pain or fever.    !! clonazepam (KLONOPIN) 0.125 MG disintegrating tablet Take 0.125 mg by mouth daily.     !! clonazepam (KLONOPIN) 0.125 MG disintegrating tablet Take 0.125 mg by mouth daily as needed (anxiety).    Ipratropium-Albuterol (COMBIVENT RESPIMAT) 20-100 MCG/ACT AERS respimat Inhale 1 puff into the lungs 3 (three) times daily.    ipratropium-albuterol (DUONEB) 0.5-2.5 (3) MG/3ML SOLN Take 3 mLs by nebulization every 6 (six) hours as needed (Shortness of Breath/ Wheezing).     mirtazapine (REMERON) 15 MG tablet Take 15 mg by mouth at bedtime.     polyethylene glycol (MIRALAX / GLYCOLAX) packet Take 17 g by mouth daily.    senna-docusate (SENOKOT-S) 8.6-50 MG per tablet Take 2 tablets by mouth 2 (two) times daily.    tiotropium (SPIRIVA) 18 MCG inhalation capsule Place 18 mcg into inhaler and inhale daily.    Vitamin D, Ergocalciferol, (DRISDOL) 50000 units CAPS capsule Take 50,000 Units by mouth every 30 (thirty) days.     !! - Potential duplicate medications found. Please discuss with provider.    STOP taking these medications     Menthol-Zinc Oxide (CALMOSEPTINE) 0.44-20.6 % OINT  DISCHARGE INSTRUCTIONS:    If you experience worsening of your admission symptoms, develop shortness of breath, life threatening emergency, suicidal or homicidal thoughts you must seek medical attention immediately by calling 911 or calling your MD immediately  if symptoms less severe.  You Must read complete instructions/literature along with all the possible adverse reactions/side effects for all the Medicines you take and that have been prescribed to you. Take any new Medicines  after you have completely understood and accept all the possible adverse reactions/side effects.   Please note  You were cared for by a hospitalist during your hospital stay. If you have any questions about your discharge medications or the care you received while you were in the hospital after you are discharged, you can call the unit and asked to speak with the hospitalist on call if the hospitalist that took care of you is not available. Once you are discharged, your primary care physician will handle any further medical issues. Please note that NO REFILLS for any discharge medications will be authorized once you are discharged, as it is imperative that you return to your primary care physician (or establish a relationship with a primary care physician if you do not have one) for your aftercare needs so that they can reassess your need for medications and monitor your lab values.    Today   SUBJECTIVE    No complaint.  VITAL SIGNS:  Blood pressure 149/65, pulse 102, temperature 99.5 F (37.5 C), temperature source Oral, resp. rate 20, height 5\' 6"  (1.676 m), weight 60.45 kg (133 lb 4.3 oz), SpO2 99 %.  I/O:   Intake/Output Summary (Last 24 hours) at 03/01/15 0837 Last data filed at 03/01/15 0802  Gross per 24 hour  Intake   3876 ml  Output    625 ml  Net   3251 ml    PHYSICAL EXAMINATION:  GENERAL:  82 y.o.-year-old patient lying in the bed with no acute distress.  EYES: Pupils equal, round, reactive to light and accommodation. No scleral icterus. Extraocular muscles intact.  HEENT: Head atraumatic, normocephalic. Oropharynx and nasopharynx clear.  NECK:  Supple, no jugular venous distention. No thyroid enlargement, no tenderness.  LUNGS: Normal breath sounds bilaterally, no wheezing, rales,rhonchi or crepitation. No use of accessory muscles of respiration.  CARDIOVASCULAR: S1, S2 normal. No murmurs, rubs, or gallops.  ABDOMEN: Soft, non-tender, non-distended. Bowel sounds  present. No organomegaly or mass.  EXTREMITIES: No pedal edema, cyanosis, or clubbing.  NEUROLOGIC: Cranial nerves II through XII are intact. Muscle strength 3-4/5 in all extremities. Sensation intact. Gait not checked.  PSYCHIATRIC: The patient is alert and oriented x 2.  SKIN: No obvious rash, lesion, or ulcer.   DATA REVIEW:   CBC  Recent Labs Lab 02/28/15 1301 03/01/15 0501  WBC 7.9  --   HGB 7.7* 10.9*  HCT 22.9*  --   PLT 230  --     Chemistries   Recent Labs Lab 02/27/15 0335  03/01/15 0501  NA 135  < > 140  K 3.8  < > 3.6  CL 104  < > 111  CO2 23  < > 23  GLUCOSE 110*  < > 102*  BUN 34*  < > 18  CREATININE 1.09  < > 0.87  CALCIUM 8.7*  < > 8.1*  AST 25  --   --   ALT 17  --   --   ALKPHOS 60  --   --   BILITOT 0.7  --   --   < > =  values in this interval not displayed.  Cardiac Enzymes  Recent Labs Lab 02/27/15 0335  TROPONINI 0.04*    Microbiology Results  Results for orders placed or performed during the hospital encounter of 02/27/15  MRSA PCR Screening     Status: None   Collection Time: 02/27/15  7:49 AM  Result Value Ref Range Status   MRSA by PCR NEGATIVE NEGATIVE Final    Comment:        The GeneXpert MRSA Assay (FDA approved for NASAL specimens only), is one component of a comprehensive MRSA colonization surveillance program. It is not intended to diagnose MRSA infection nor to guide or monitor treatment for MRSA infections.     RADIOLOGY:  Dg Hip Operative Unilat W Or W/o Pelvis Left  02/27/2015  CLINICAL DATA:  Hip replacement EXAM: OPERATIVE left HIP (WITH PELVIS IF PERFORMED) 3 VIEWS TECHNIQUE: Fluoroscopic spot image(s) were submitted for interpretation post-operatively. COMPARISON:  X-rays of earlier the same day. FINDINGS: Three intraoperative spot fluoro films are included on this study. There is no laterality marker on any of the images. The first image is of a hip joint showing femoral neck fracture. Given that the left  femoral neck was fractured on the films performed earlier the same day, the working assumption for this exam is that it is of the left hip. The next image shows left hip prosthesis with sizing component. Final image again shows hip replacement hardware although the femoral component has been incompletely visualized. IMPRESSION: Intraoperative assessment during left hip replacement. Incomplete visualization of the femoral stem. Electronically Signed   By: Kennith Center M.D.   On: 02/27/2015 18:01   Dg Hip Unilat W Or W/o Pelvis 2-3 Views Left  02/27/2015  CLINICAL DATA:  82 year old male status post left hip replacement EXAM: DG HIP (WITH OR WITHOUT PELVIS) 2-3V LEFT COMPARISON:  Preoperative radiographs 02/27/2015 at 3:55 a.m. FINDINGS: Interval surgical changes of left hip arthroplasty without evidence of immediate hardware complication. The visualized bony pelvis is intact. Atherosclerotic calcifications present in the superficial femoral artery. IMPRESSION: Postoperative changes of left hip arthroplasty without evidence of immediate complication. Atherosclerotic vascular calcifications. Electronically Signed   By: Malachy Moan M.D.   On: 02/27/2015 18:54        Management plans discussed with the patient, family and they are in agreement.  CODE STATUS:     Code Status Orders        Start     Ordered   02/27/15 0817  Do not attempt resuscitation (DNR)   Continuous    Question Answer Comment  In the event of cardiac or respiratory ARREST Do not call a "code blue"   In the event of cardiac or respiratory ARREST Do not perform Intubation, CPR, defibrillation or ACLS   In the event of cardiac or respiratory ARREST Use medication by any route, position, wound care, and other measures to relive pain and suffering. May use oxygen, suction and manual treatment of airway obstruction as needed for comfort.      02/27/15 0817    Code Status History    Date Active Date Inactive Code Status  Order ID Comments User Context   02/27/2015  7:42 AM 02/27/2015  8:17 AM Full Code [id number]  Arnaldo Natal, MD Inpatient   02/27/2015  4:57 AM 02/27/2015  7:42 AM Full Code [id number]  Kennedy Bucker, MD ED    Advance Directive Documentation        Most Recent Value   Type of  Advance Directive  Out of facility DNR (pink MOST or yellow form)   Pre-existing out of facility DNR order (yellow form or pink MOST form)  Yellow form placed in chart (order not valid for inpatient use)   "MOST" Form in Place?        TOTAL TIME TAKING CARE OF THIS PATIENT: 36  minutes.    Shaune Pollack M.D on 03/01/2015 at 8:37 AM  Between 7am to 6pm - Pager - [phone]  After 6pm go to [email or web address] - password [email or web address] [facility]  [place] [facility]  Office  [phone]  CC: Primary care physician; Keane Police, MD

## 2015-03-01 NOTE — Progress Notes (Signed)
Physical Therapy Treatment Patient Details Name: Fordyce Lepak MRN: [id number] DOB: [birth year] Today's Date: 03/01/2015    History of Present Illness Pt is an 82 y.o. male s/p fall (pt LTC resident at [facility]) sustaining L femoral neck hip fx and also imaging showing L1 compression fx of indeterminate age.  Pt s/p L THA anterior approach 02/27/15.  Per MD Rosita Kea note, L1 compression fx treatment depending on how pt does with therapy (level of pain).  PMH includes dementia.    PT Comments    Pt is making limited progress towards goals this date. Pt febrile and is very lethargic, unable to maintain alertness this date and kept falling asleep during there-ex. Pt refuses mobility, however did sit in recliner for a majority of the day. Improved endurance/sequencing of there-ex with cues for sequencing. Will continue to progress as necessary.  Follow Up Recommendations  Home health PT;Supervision/Assistance - 24 hour     Equipment Recommendations       Recommendations for Other Services       Precautions / Restrictions Precautions Precautions: Fall;Anterior Hip;Back Precaution Comments: L1 compression fx spinal precautions.  Per orders:  Direct anterior: no hip precautions on operative hip Restrictions Weight Bearing Restrictions: Yes LLE Weight Bearing: Weight bearing as tolerated    Mobility  Bed Mobility               General bed mobility comments: not performed as pt unable to maintain alertness  Transfers                    Ambulation/Gait                 Stairs            Wheelchair Mobility    Modified Rankin (Stroke Patients Only)       Balance                                    Cognition Arousal/Alertness: Lethargic Behavior During Therapy: WFL for tasks assessed/performed Overall Cognitive Status: Within Functional Limits for tasks assessed                      Exercises Other Exercises Other  Exercises: supine ther-ex performed with L LE including ankle pumps, SLRs, hip abd/add, and SAQ. Attempted quad sets, however pt unable to fully comprehend exercise. All ther-ex performed x 10-12 reps with mod assist and cues for sequencing.    General Comments        Pertinent Vitals/Pain Pain Assessment: 0-10 Pain Score: 8  Pain Location: L LE Pain Descriptors / Indicators: Operative site guarding Pain Intervention(s): Limited activity within patient's tolerance    Home Living                      Prior Function            PT Goals (current goals can now be found in the care plan section) Acute Rehab PT Goals Patient Stated Goal: to be more independent. PT Goal Formulation: With patient Time For Goal Achievement: 03/14/15 Potential to Achieve Goals: Good Progress towards PT goals: Progressing toward goals    Frequency  BID    PT Plan Current plan remains appropriate    Co-evaluation             End of Session   Activity Tolerance: Patient limited  by pain;Patient limited by fatigue Patient left: in bed;with bed alarm set;with family/visitor present     Time: 1530-1541 PT Time Calculation (min) (ACUTE ONLY): 11 min  Charges:  $Therapeutic Exercise: 8-22 mins                    G Codes:      Dashon Mcintire 03/17/2015, 3:51 PM  Elizabeth Palau, PT, DPT [phone]

## 2015-03-01 NOTE — Progress Notes (Addendum)
Patient has temp of 102.8. Paged Dr.Chen. Waiting for callback.  Callback received from Dr.Chen-updated with pt's temp of 102.8 and heart rate of 116. No new orders given. Will cont to monitor the patient.

## 2015-03-01 NOTE — Evaluation (Signed)
Clinical/Bedside Swallow Evaluation Patient Details  Name: James Mcguire MRN: [id number] Date of Birth: 1932-04-23  Today's Date: 03/01/2015 Time: SLP Start Time (ACUTE ONLY): 1215 SLP Stop Time (ACUTE ONLY): 1300 SLP Time Calculation (min) (ACUTE ONLY): 45 min  Past Medical History:  Past Medical History  Diagnosis Date  . Dementia   . Dysphagia   . Insomnia   . Failure to thrive in adult   . Emphysema of lung (HCC)   . Major depressive disorder (HCC)   . Dementia    Past Surgical History:  Past Surgical History  Procedure Laterality Date  . None    . Total hip arthroplasty Left 02/27/2015    Procedure: TOTAL HIP ARTHROPLASTY ANTERIOR APPROACH;  Surgeon: Kennedy Bucker, MD;  Location: [facility] ORS;  Service: Orthopedics;  Laterality: Left;   HPI:      Assessment / Plan / Recommendation Clinical Impression  Patient presenting with clinical indicators of aspiration with thin liquid and poor mastication of Dysphagia III solids.  Patient has no significant indicators of aspiration or oral residue with pureed solids and honey-thick liquid.  Recommend Dysphagia I with honey-thick liquid diet.  Patient would benefit from referral to speech therapy when re-admitted to his previous nursing home setting.    Aspiration Risk       Diet Recommendation Dysphagia 1 (Puree);Honey-thick liquid        Other  Recommendations Recommended Consults: Other (Comment) (Speech therapy at nursing home)   Follow up Recommendations       Frequency and Duration            Prognosis        Swallow Study   General      Oral/Motor/Sensory Function Overall Oral Motor/Sensory Function: Generalized oral weakness   Ice Chips     Thin Liquid Thin Liquid: Impaired Presentation: Spoon;Cup (Immediate and delayed cough and throat clear)    Nectar Thick     Honey Thick Honey Thick Liquid: Within functional limits Presentation: Spoon (No S/S aspiration)   Puree Puree: Within functional  limits Presentation: Spoon (No S/S aspiration, no significant oral residue)   Solid   GO   Solid: Impaired Presentation: Spoon (Refused to chew)       James Primrose, MS/CCC- SLP  James Mcguire 03/01/2015,1:06 PM

## 2015-03-01 NOTE — Progress Notes (Signed)
Foley cath removed per order, cath tip intact, peri care given, briefs applied, patient tolerated procedure well. Will cont to monitor the patient.

## 2015-03-01 NOTE — Anesthesia Postprocedure Evaluation (Signed)
Anesthesia Post Note  Patient: James Mcguire  Procedure(s) Performed: Procedure(s) (LRB): TOTAL HIP ARTHROPLASTY ANTERIOR APPROACH (Left)  Patient location during evaluation: PACU Anesthesia Type: General Level of consciousness: awake and alert Pain management: pain level controlled Vital Signs Assessment: post-procedure vital signs reviewed and stable Respiratory status: spontaneous breathing, nonlabored ventilation, respiratory function stable and patient connected to nasal cannula oxygen Cardiovascular status: blood pressure returned to baseline and stable Postop Assessment: no signs of nausea or vomiting Anesthetic complications: no    Last Vitals:  Filed Vitals:   03/01/15 0746 03/01/15 1429  BP: 149/65 155/79  Pulse: 102 116  Temp: 37.5 C 39.3 C  Resp: 20 20    Last Pain:  Filed Vitals:   03/01/15 1430  PainSc: Asleep                 Lenard Simmer

## 2015-03-01 NOTE — Progress Notes (Signed)
Spoke with Shanda Bumps, [facility] rep at [phone], to notify of non-emergent EMS transport.  Auth notification reference given as [id number].   Service date range good from 03/01/15 - 05/30/15.   Gap exception requested to determine if services can be considered at an in-network level.

## 2015-03-02 LAB — URINALYSIS COMPLETE WITH MICROSCOPIC (ARMC ONLY)
BILIRUBIN URINE: NEGATIVE
GLUCOSE, UA: NEGATIVE mg/dL
Ketones, ur: NEGATIVE mg/dL
NITRITE: NEGATIVE
Protein, ur: 100 mg/dL — AB
Specific Gravity, Urine: 1.027 (ref 1.005–1.030)
pH: 5 (ref 5.0–8.0)

## 2015-03-02 LAB — BASIC METABOLIC PANEL
Anion gap: 6 (ref 5–15)
BUN: 21 mg/dL — AB (ref 6–20)
CALCIUM: 8.1 mg/dL — AB (ref 8.9–10.3)
CHLORIDE: 110 mmol/L (ref 101–111)
CO2: 24 mmol/L (ref 22–32)
CREATININE: 0.95 mg/dL (ref 0.61–1.24)
GFR calc non Af Amer: >60 mL/min (ref >60)
Glucose, Bld: 112 mg/dL — ABNORMAL HIGH (ref 65–99)
Potassium: 3.3 mmol/L — ABNORMAL LOW (ref 3.5–5.1)
SODIUM: 140 mmol/L (ref 135–145)

## 2015-03-02 LAB — CBC
HCT: 29.2 % — ABNORMAL LOW (ref 40.0–52.0)
Hemoglobin: 9.8 g/dL — ABNORMAL LOW (ref 13.0–18.0)
MCH: 30 pg (ref 26.0–34.0)
MCHC: 33.5 g/dL (ref 32.0–36.0)
MCV: 89.5 fL (ref 80.0–100.0)
PLATELETS: 240 10*3/uL (ref 150–440)
RBC: 3.26 MIL/uL — AB (ref 4.40–5.90)
RDW: 17.5 % — AB (ref 11.5–14.5)
WBC: 12.1 10*3/uL — AB (ref 3.8–10.6)

## 2015-03-02 MED ORDER — DEXTROSE 5 % IV SOLN
1.0000 g | INTRAVENOUS | Status: DC
  Administered 2015-03-02: 1 g via INTRAVENOUS
  Filled 2015-03-02 (×2): qty 10

## 2015-03-02 NOTE — Progress Notes (Signed)
[facility] - [place] at [facility]   PATIENT NAME: James Mcguire    MR#:  [id number]  DATE OF BIRTH:  24-Feb-1932  SUBJECTIVE:  CHIEF COMPLAINT:   Chief Complaint  Patient presents with  . Fall   patient feels comfortable today, denies any pain, status post left hip arthroplasty via anterior approach. Patient's hemoglobin level was found to be 7.7 on the 16th. Postoperatively, and the patient was transfused 2 units of packed blood cells, hemoglobin level remains stable at 9.8 today.   REVIEW OF SYSTEMS:  CONSTITUTIONAL: Has fever, has weakness.  EYES: No blurred or double vision.  EARS, NOSE, AND THROAT: No tinnitus or ear pain.  RESPIRATORY: No cough, shortness of breath, wheezing or hemoptysis.  CARDIOVASCULAR: No chest pain, orthopnea, edema.  GASTROINTESTINAL: No nausea, vomiting, diarrhea or abdominal pain.  GENITOURINARY: No dysuria, hematuria.  ENDOCRINE: No polyuria, nocturia,  HEMATOLOGY: No anemia, easy bruising or bleeding SKIN: No rash or lesion. MUSCULOSKELETAL: left hip pain. NEUROLOGIC: No tingling, numbness, weakness.  PSYCHIATRY: No anxiety or depression.   DRUG ALLERGIES:  No Known Allergies  VITALS:  Blood pressure 150/64, pulse 77, temperature 97.8 F (36.6 C), temperature source Oral, resp. rate 16, height  (1.676 m), weight 60.737 kg (133 lb 14.4 oz), SpO2 98 %.  PHYSICAL EXAMINATION:  GENERAL:  82 y.o.-year-old patient lying in the bed NAD.  EYES: Pupils equal, round, reactive to light and accommodation. No scleral icterus. Extraocular muscles intact. Pale conjunctiva. HEENT: Head atraumatic, normocephalic. Oropharynx and nasopharynx clear.  NECK:  Supple, no jugular venous distention. No thyroid enlargement, no tenderness.  LUNGS: Normal breath sounds bilaterally, no wheezing, rales,rhonchi or crepitation. No use of accessory muscles of respiration.  CARDIOVASCULAR: S1, S2 normal. No murmurs, rubs, or gallops.   ABDOMEN: Soft, nontender, nondistended. Bowel sounds present. No organomegaly or mass.  EXTREMITIES: No pedal edema, cyanosis, or clubbing.  NEUROLOGIC: Cranial nerves II through XII are intact. Left anterior hip incision, no drainage, bleeding, swelling, pain on palpation PSYCHIATRIC: The patient is alert and oriented x 2, demented.  SKIN: No obvious rash, lesion, or ulcer.    LABORATORY PANEL:   CBC  Recent Labs Lab 03/02/15 0353  WBC 12.1*  HGB 9.8*  HCT 29.2*  PLT 240   ------------------------------------------------------------------------------------------------------------------  Chemistries   Recent Labs Lab 02/27/15 0335  03/02/15 0353  NA 135  < > 140  K 3.8  < > 3.3*  CL 104  < > 110  CO2 23  < > 24  GLUCOSE 110*  < > 112*  BUN 34*  < > 21*  CREATININE 1.09  < > 0.95  CALCIUM 8.7*  < > 8.1*  AST 25  --   --   ALT 17  --   --   ALKPHOS 60  --   --   BILITOT 0.7  --   --   < > = values in this interval not displayed. ------------------------------------------------------------------------------------------------------------------  Cardiac Enzymes  Recent Labs Lab 02/27/15 0335  TROPONINI 0.04*   ------------------------------------------------------------------------------------------------------------------  RADIOLOGY:  Dg Chest 1 View  03/01/2015  CLINICAL DATA:  Fever and tachycardia EXAM: CHEST 1 VIEW COMPARISON:  February 27, 2015 FINDINGS: The patient's hand overlaps the lateral left hemithorax. There is mild scarring in the left base. There is no edema or consolidation. Heart is upper normal in size with pulmonary vascularity within normal limits. No adenopathy. There is evidence of old rib trauma with remodeling bilaterally. Bones are osteoporotic. IMPRESSION: Slight  scarring left base. No edema or consolidation. No change in cardiac silhouette. Electronically Signed   By: Bretta Bang III M.D.   On: 03/01/2015 15:22    EKG:   Orders  placed or performed during the hospital encounter of 10/05/14  . EKG 12-Lead  . EKG 12-Lead  . EKG    ASSESSMENT AND PLAN:   1. Left hip fracture, status post left hip arthroplasty via anterior approach:  Hip surgery POD3. Pain control, DVT prophylaxis. Likely discharge  to skilled nursing facility where he resides within 1-2 days  2. Dehydration. Improved with IVF.Marland Kitchen   3. Anemia of chronic disease plus acute blood loss.  S/p PRBC transfusion 2 units, Hb 9.8 today, following closely.  4. COPD with emphasema. Stable. NEB prn. Weaned off oxygen and O2 sats are 98% on room air, following closely  5. Depression: Continue Remeron (may be a component of dementia as well)  6. Dementia. Aspiration and fall precaution.  7. Hypotension. Given NS bolus x 2, resolved 8. Failure to thrive, adult, patient's oral intake is around 25%, continue Remeron, may benefit from palliative care intervention as outpatient.   9 Fever, etiology remains unclear, patient's chest x-ray revealed scarring in left base, but otherwise no abnormality, urinalysis was in recently, revealed hematuria, CBC showed elevated white blood cell count but blood cultures so far are negative. The patient is not on any antibiotics at present, follow CBC in the morning.  .   All the records are reviewed and case discussed with Care Management/Social Workerr. Management plans discussed with the patient, family and they are in agreement.  CODE STATUS: DNR  TOTAL TIME TAKING CARE OF THIS PATIENT: 40 minutes  POSSIBLE D/C IN 2 DAYS, DEPENDING ON CLINICAL CONDITION.   Katharina Caper M.D on 03/02/2015 at 2:48 PM  Between 7am to 6pm - Pager - [phone]  After 6pm go to [email or web address] - password [email or web address] [facility]  [place] [facility]  Office  [phone]  CC: Primary care physician; Keane Police, MD

## 2015-03-02 NOTE — Progress Notes (Signed)
Physical Therapy Treatment Patient Details Name: James Mcguire MRN: [id number] DOB: 1932-02-28 Today's Date: 03/02/2015    History of Present Illness Pt is an 82 y.o. male s/p fall (pt LTC resident at [facility]) sustaining L femoral neck hip fx and also imaging showing L1 compression fx of indeterminate age.  Pt s/p L THA anterior approach 02/27/15.  Per MD Rosita Kea note, L1 compression fx treatment depending on how pt does with therapy (level of pain).  PMH includes dementia.    PT Comments    Pt tolerating treatment session well, motivated and able to complete session as planned, but greatly limited by pain. Pt continues to make progress toward goals as evidenced by ability to tolerate practice of sit-to-stand transfers x6. Pt's greatest limitation continues to be pain inhibition and confusion which continues to limit ability to perform all mobility at baseline function. Patient presenting with impairment of strength, pain, range of motion, balance, oxygen perfusion, and activity tolerance, limiting ability to perform ADL and mobility tasks at  baseline level of function. Patient will benefit from skilled intervention to address the above impairments and limitations, in order to restore to prior level of function, improve patient safety upon discharge, and to decrease caregiver burden.    Follow Up Recommendations  Home health PT;Supervision/Assistance - 24 hour     Equipment Recommendations       Recommendations for Other Services       Precautions / Restrictions Precautions Precautions: Anterior Hip;Fall;Back Precaution Comments: L1 compression fx spinal precautions.  Per orders:  Direct anterior: no hip precautions on operative hip Restrictions Weight Bearing Restrictions: Yes LLE Weight Bearing: Weight bearing as tolerated    Mobility  Bed Mobility Overal bed mobility: Needs Assistance Bed Mobility: Supine to Sit;Sit to Supine     Supine to sit: Mod assist Sit to  supine: Mod assist   General bed mobility comments: Very much inhibited by pain.   Transfers Overall transfer level: Needs assistance Equipment used: Rolling walker (2 wheeled) Transfers: Sit to/from Stand Sit to Stand: Min assist         General transfer comment: Performed 6x, c repeated VC for TKE bilat, and bringing hips over feet, neither of which pt is abel to do due to pain; pt reports significant pain with LLE WB and is hesitant to stand up completely.   Ambulation/Gait                 Stairs            Wheelchair Mobility    Modified Rankin (Stroke Patients Only)       Balance   Sitting-balance support: Feet supported Sitting balance-Leahy Scale: Fair     Standing balance support: Bilateral upper extremity supported;During functional activity Standing balance-Leahy Scale: Poor                      Cognition Arousal/Alertness: Lethargic Behavior During Therapy: WFL for tasks assessed/performed;Anxious Overall Cognitive Status: History of cognitive impairments - at baseline (disoriented, c short term memory deficits within session. )       Memory: Decreased short-term memory              Exercises Total Joint Exercises Heel Slides: AAROM;Left;10 reps;Supine Hip ABduction/ADduction: AAROM;Left;10 reps;Supine    General Comments        Pertinent Vitals/Pain Pain Assessment: 0-10 Pain Score: 5  Pain Location: LLE, denies back pain.  Pain Intervention(s): Limited activity within patient's tolerance;Monitored during session;Repositioned;Relaxation  Home Living                      Prior Function            PT Goals (current goals can now be found in the care plan section) Acute Rehab PT Goals Patient Stated Goal: to be more independent. PT Goal Formulation: With patient Time For Goal Achievement: 03/14/15 Potential to Achieve Goals: Fair Progress towards PT goals: PT to reassess next treatment     Frequency  BID    PT Plan Current plan remains appropriate    Co-evaluation             End of Session Equipment Utilized During Treatment: Oxygen;Gait belt Activity Tolerance: Patient limited by fatigue;Patient limited by pain;Patient tolerated treatment well Patient left: with call bell/phone within reach;in bed;with bed alarm set;with nursing/sitter in room     Time: 1610-9604 PT Time Calculation (min) (ACUTE ONLY): 31 min  Charges:  $Therapeutic Exercise: 8-22 mins $Therapeutic Activity: 8-22 mins                    G Codes:      3:54 PM, 03-06-2015 Rosamaria Lints, PT, DPT PRN Physical Therapist at [facility] [place] License # [id number] [phone] (mobile)

## 2015-03-02 NOTE — Progress Notes (Signed)
Subjective: 3 Days Post-Op Procedure(s) (LRB): TOTAL HIP ARTHROPLASTY ANTERIOR APPROACH (Left) Patient reports pain as mild.   Patient is well but recently received 2 units of PRBC PT recommending home-health PT Negative for chest pain and shortness of breath Fever: low grade fever 99.2 Gastrointestinal:Negative for nausea and vomiting Pt using nebulizer this morning during exam.  Objective: Vital signs in last 24 hours: Temp:  [98.5 F (36.9 C)-102.8 F (39.3 C)] 99.2 F (37.3 C) (02/18 0438) Pulse Rate:  [67-116] 67 (02/18 0438) Resp:  [16-22] 16 (02/18 0438) BP: (94-155)/(50-79) 113/55 mmHg (02/18 0438) SpO2:  [89 %-100 %] 100 % (02/18 0438) Weight:  [60.737 kg (133 lb 14.4 oz)] 60.737 kg (133 lb 14.4 oz) (02/18 0500)  Intake/Output from previous day:  Intake/Output Summary (Last 24 hours) at 03/02/15 0648 Last data filed at 03/02/15 0453  Gross per 24 hour  Intake 983.33 ml  Output    852 ml  Net 131.33 ml    Intake/Output this shift: Total I/O In: 240 [P.O.:240] Out: 502 [Urine:500; Stool:2]  Labs:  Recent Labs  02/28/15 0554 02/28/15 1301 03/01/15 0501 03/01/15 1513 03/02/15 0353  HGB 9.0* 7.7* 10.9* 10.3* 9.8*    Recent Labs  03/01/15 1513 03/02/15 0353  WBC 9.2 12.1*  RBC 3.47* 3.26*  HCT 30.6* 29.2*  PLT 263 240    Recent Labs  03/01/15 0501 03/02/15 0353  NA 140 140  K 3.6 3.3*  CL 111 110  CO2 23 24  BUN 18 21*  CREATININE 0.87 0.95  GLUCOSE 102* 112*  CALCIUM 8.1* 8.1*   No results for input(s): LABPT, INR in the last 72 hours.   EXAM General - Patient is Alert and Appropriate Extremity - ABD soft Neurovascular intact Sensation intact distally Intact pulses distally Dorsiflexion/Plantar flexion intact Incision: moderate drainage Dressing/Incision -  Honeycomb dressing intact. Motor Function - intact, moving foot and toes well on exam.   Past Medical History  Diagnosis Date  . Dementia   . Dysphagia   . Insomnia   .  Failure to thrive in adult   . Emphysema of lung (HCC)   . Major depressive disorder (HCC)   . Dementia     Assessment/Plan: 3 Days Post-Op Procedure(s) (LRB): TOTAL HIP ARTHROPLASTY ANTERIOR APPROACH (Left) Active Problems:   Closed left hip fracture (HCC)   Protein-calorie malnutrition, severe  Estimated body mass index is 21.62 kg/(m^2) as calculated from the following:   Height as of this encounter:  (1.676 m).   Weight as of this encounter: 60.737 kg (133 lb 14.4 oz). Advance diet Up with therapy   Pt received 2 units of PRBC 2 days ago.  Hg up to 9.8 this AM.. Pt reports that he has had a BM. Upon discharge, continue Lovenox  q 24 hours x 14 days for DVT prophylaxis.  Dressing changes as needed.  DVT Prophylaxis - Lovenox, Foot Pumps and TED hose Weight-Bearing as tolerated to left leg  Dedra Skeens, PA-C [facility] Orthopaedic Surgery 03/02/2015, 6:48 AM

## 2015-03-02 NOTE — Progress Notes (Signed)
Physical Therapy Treatment Patient Details Name: James Mcguire MRN: [id number] DOB: 21-Aug-1932 Today's Date: 03/02/2015    History of Present Illness Pt is an 82 y.o. male s/p fall (pt LTC resident at [facility]) sustaining L femoral neck hip fx and also imaging showing L1 compression fx of indeterminate age.  Pt s/p L THA anterior approach 02/27/15.  Per MD Rosita Kea note, L1 compression fx treatment depending on how pt does with therapy (level of pain).  PMH includes dementia.    PT Comments    Pt in bed.  Initially declines therapy but agreed with encouragement.  Pt required minimal assistance to get to the edge of bed.  He was able to remain sitting with only occasional tactile cues to maintain sitting balance with feet supported for about 15 minutes.  He was able to participate in seated exercise as below.  He returned to supine with minimal assistance for legs to rest while waiting for CNA to assist with transfer.  He was then able to transfer to recliner at bedside with moderate assistance +2 taking small shuffling sidesteps to chair.     Follow Up Recommendations  Home health PT;Supervision/Assistance - 24 hour     Equipment Recommendations       Recommendations for Other Services       Precautions / Restrictions Precautions Precautions: Anterior Hip;Fall;Back Precaution Comments: L1 compression fx spinal precautions.  Per orders:  Direct anterior: no hip precautions on operative hip Restrictions Weight Bearing Restrictions: Yes LLE Weight Bearing: Weight bearing as tolerated    Mobility  Bed Mobility Overal bed mobility: Needs Assistance Bed Mobility: Supine to Sit;Sit to Supine Rolling: Min assist   Supine to sit: Min assist Sit to supine: Min assist (For L LE)   General bed mobility comments: Improved bed mobility and participation today.  Transfers Overall transfer level: Needs assistance Equipment used: Rolling walker (2 wheeled) Transfers: Sit to/from  Stand Sit to Stand: Mod assist;+2 physical assistance         General transfer comment: Small shuffling steps but no lob's.  Minimal flexed posture.  Able to follow vc's.  Ambulation/Gait Ambulation/Gait assistance: Mod assist;+2 physical assistance Ambulation Distance (Feet): 3 Feet Assistive device: Rolling walker (2 wheeled) Gait Pattern/deviations: Shuffle     General Gait Details: bed to chair   Stairs            Wheelchair Mobility    Modified Rankin (Stroke Patients Only)       Balance Overall balance assessment: Needs assistance Sitting-balance support: Feet supported (Sat edge of bed for 15 minutes with cga +1 to eat and tx) Sitting balance-Leahy Scale: Fair Sitting balance - Comments: Unsafe to sit unattended edge of bed   Standing balance support: Bilateral upper extremity supported Standing balance-Leahy Scale: Poor Standing balance comment: Unsafe to stand or transfer without assist                    Cognition Arousal/Alertness: Awake/alert Behavior During Therapy: WFL for tasks assessed/performed Overall Cognitive Status: Within Functional Limits for tasks assessed       Memory:  ("Why am I here?" repeated multiple times.)              Exercises Other Exercises Other Exercises: Seated AAROM LLE AROM RLE    General Comments        Pertinent Vitals/Pain Pain Assessment: 0-10 Pain Score: 8  Pain Location: L LE Pain Descriptors / Indicators: Operative site guarding;Discomfort Pain Intervention(s): Limited activity  within patient's tolerance;Repositioned    Home Living                      Prior Function            PT Goals (current goals can now be found in the care plan section) Progress towards PT goals: Progressing toward goals    Frequency  BID    PT Plan Current plan remains appropriate    Co-evaluation             End of Session Equipment Utilized During Treatment: Oxygen;Gait  belt Activity Tolerance: Patient limited by fatigue;Patient limited by pain Patient left: in chair;with call bell/phone within reach;with chair alarm set;with nursing/sitter in room     Time: 1610-9604 PT Time Calculation (min) (ACUTE ONLY): 39 min  Charges:  $Therapeutic Exercise: 8-22 mins $Therapeutic Activity: 8-22 mins                    G Codes:      Danielle Dess, PTA 03/02/2015, 9:54 AM    >

## 2015-03-03 DIAGNOSIS — R627 Adult failure to thrive: Secondary | ICD-10-CM

## 2015-03-03 DIAGNOSIS — Z9289 Personal history of other medical treatment: Secondary | ICD-10-CM

## 2015-03-03 DIAGNOSIS — I959 Hypotension, unspecified: Secondary | ICD-10-CM

## 2015-03-03 DIAGNOSIS — E860 Dehydration: Secondary | ICD-10-CM

## 2015-03-03 DIAGNOSIS — N390 Urinary tract infection, site not specified: Secondary | ICD-10-CM

## 2015-03-03 DIAGNOSIS — D62 Acute posthemorrhagic anemia: Secondary | ICD-10-CM

## 2015-03-03 LAB — TYPE AND SCREEN
ABO/RH(D): A POS
Antibody Screen: NEGATIVE
UNIT DIVISION: 0
UNIT DIVISION: 0
Unit division: 0
Unit division: 0

## 2015-03-03 LAB — BLOOD CULTURE ID PANEL (REFLEXED)
Acinetobacter baumannii: NOT DETECTED
CANDIDA ALBICANS: NOT DETECTED
CANDIDA GLABRATA: NOT DETECTED
CANDIDA KRUSEI: NOT DETECTED
CARBAPENEM RESISTANCE: NOT DETECTED
Candida parapsilosis: NOT DETECTED
Candida tropicalis: NOT DETECTED
ENTEROBACTER CLOACAE COMPLEX: NOT DETECTED
ENTEROBACTERIACEAE SPECIES: NOT DETECTED
ENTEROCOCCUS SPECIES: NOT DETECTED
ESCHERICHIA COLI: NOT DETECTED
Haemophilus influenzae: NOT DETECTED
Klebsiella oxytoca: NOT DETECTED
Klebsiella pneumoniae: NOT DETECTED
LISTERIA MONOCYTOGENES: NOT DETECTED
Methicillin resistance: NOT DETECTED
NEISSERIA MENINGITIDIS: NOT DETECTED
PSEUDOMONAS AERUGINOSA: NOT DETECTED
Proteus species: NOT DETECTED
STAPHYLOCOCCUS AUREUS BCID: NOT DETECTED
STAPHYLOCOCCUS SPECIES: NOT DETECTED
STREPTOCOCCUS AGALACTIAE: NOT DETECTED
STREPTOCOCCUS PNEUMONIAE: NOT DETECTED
STREPTOCOCCUS SPECIES: NOT DETECTED
Serratia marcescens: NOT DETECTED
Streptococcus pyogenes: NOT DETECTED
Vancomycin resistance: NOT DETECTED

## 2015-03-03 LAB — CBC
HEMATOCRIT: 27.4 % — AB (ref 40.0–52.0)
HEMOGLOBIN: 9.3 g/dL — AB (ref 13.0–18.0)
MCH: 30.9 pg (ref 26.0–34.0)
MCHC: 34 g/dL (ref 32.0–36.0)
MCV: 91 fL (ref 80.0–100.0)
Platelets: 258 10*3/uL (ref 150–440)
RBC: 3.01 MIL/uL — ABNORMAL LOW (ref 4.40–5.90)
RDW: 16.4 % — AB (ref 11.5–14.5)
WBC: 8.9 10*3/uL (ref 3.8–10.6)

## 2015-03-03 LAB — POTASSIUM: POTASSIUM: 3.1 mmol/L — AB (ref 3.5–5.1)

## 2015-03-03 MED ORDER — CEPHALEXIN 500 MG PO CAPS: 500.0000 mg | ORAL_CAPSULE | Freq: Two times a day (BID) | ORAL | Status: AC

## 2015-03-03 MED ORDER — FE FUMARATE-B12-VIT C-FA-IFC PO CAPS: 1.0000 | ORAL_CAPSULE | Freq: Three times a day (TID) | ORAL | Status: AC

## 2015-03-03 MED ORDER — OXYCODONE HCL 5 MG PO TABS: 5.0000 mg | ORAL_TABLET | Freq: Four times a day (QID) | ORAL | Status: AC | PRN

## 2015-03-03 MED ORDER — HYDROCODONE-ACETAMINOPHEN 5-325 MG PO TABS: 1.0000 | ORAL_TABLET | ORAL | Status: AC | PRN

## 2015-03-03 NOTE — Progress Notes (Signed)
Patient is medically stable for D/C back to [facility] today. Per Donivan Scull weekend supervisor at [facility] patient will go to room 331-A. RN will call report to A-wing nurses station and arrange EMS for transport. Clinical Child psychotherapist (CSW) sent D/C Summary, FL2 and D/C Packet to [employer] via HUB Friday 03/01/15. Patient's daughter Misty Stanley and son in law Rosanne Ashing are at bedside and aware of D/C today. Please reconsult if future social work needs arise. CSW signing off.   Jetta Lout, LCSW [phone]

## 2015-03-03 NOTE — Progress Notes (Signed)
Pt transferring back to [facility] report called to [facility] , pt will be in and out cath q 8 hours there ,  Saline lock kld/c and dressing left anterior changed

## 2015-03-03 NOTE — Care Management Important Message (Signed)
Important Message  Patient Details  Name: James Mcguire MRN: [id number] Date of Birth: 01/30/1932   Medicare Important Message Given:  Yes    Kristofer Schaffert A, RN 03/03/2015, 2:37 PM

## 2015-03-03 NOTE — Progress Notes (Addendum)
Physical Therapy Treatment Patient Details Name: James Mcguire MRN: [id number] DOB: 08/10/1932 Today's Date: 03/03/2015    History of Present Illness Pt is an 82 y.o. male s/p fall (pt LTC resident at [facility]) sustaining L femoral neck hip fx and also imaging showing L1 compression fx of indeterminate age.  Pt s/p L THA anterior approach 02/27/15.  Per MD Rosita Kea note, L1 compression fx treatment depending on how pt does with therapy (level of pain).  PMH includes dementia. Now seen on POD4, pt c/o Left heel pain only upon entry.     PT Comments    Pt received semirecumbent upon entry, nasal cannual doffed, RLE crossed over LLE at the level of the ankles and pillow beneath popliteal fossa bilat. SaO2 registered as 85%, quickly donned [place] which was at side, and returned to 90% in spite of almost exclusive mouth breathing; pt is unable to modify breathing method, expressing confusion when discussed. Left heel is visualized due to c/o of pain, demonstrating non blanching erythema, painful to palpation, skin intact, suspect of stage-I pressure ulcer (RN/NA notified). Pt tolerating treatment session well today, participatory and able to complete entire PT sesssion as planned, however weight bearing activities are deferred due to new onset heel pain. Pt continues to make progress toward goals as evidenced by improved tolerance to activity, improved tolerable ROM, and improved strength with therex. Pt's greatest limitation continues to be pain c weight bearing and generalized weakness, both of which continue to limit ability to perform all functional mobility at baseline function. Patient presenting with impairment of strength, pain, range of motion, balance, skin integrity, and activity tolerance, limiting ability to perform ADL and mobility tasks at  baseline level of function. Patient will benefit from skilled intervention to address the above impairments and limitations, in order to restore to prior  level of function, improve patient safety upon discharge, and to decrease caregiver burden.   Addendum *repositioned LLE on 3 pillows for heel clearance in the setting of leg crossing; discussed c nursing team. *pt noted to have a wet sounding cough at end of session, not noticed yesterday.    Follow Up Recommendations  Home health PT;Supervision/Assistance - 24 hour     Equipment Recommendations       Recommendations for Other Services       Precautions / Restrictions Precautions Precautions: Anterior Hip;Fall;Back Precaution Comments: L1 compression fx spinal precautions.  Per orders:  Direct anterior: no hip precautions on operative hip Restrictions Weight Bearing Restrictions: Yes LLE Weight Bearing: Weight bearing as tolerated    Mobility  Bed Mobility Overal bed mobility:  (deferred today, more lethargic, requires constant stimulation to keep awake. )                Transfers                    Ambulation/Gait                 Stairs            Wheelchair Mobility    Modified Rankin (Stroke Patients Only)       Balance                                    Cognition Arousal/Alertness: Lethargic Behavior During Therapy: WFL for tasks assessed/performed;Flat affect Overall Cognitive Status: History of cognitive impairments - at baseline  Memory: Decreased short-term memory (conversational and logical, but attests to limitations of short term memory throughout session. Does recall PT from prior session. )              Exercises Total Joint Exercises Ankle Circles/Pumps: AAROM;Left;15 reps;Supine (MinA to perform, limited DF range. ) Short Arc Quad: AAROM;Left;15 reps;Supine (ModA to perform) Heel Slides: AAROM;Left;Supine;15 reps Hip ABduction/ADduction: AAROM;Left;Supine;15 reps    General Comments        Pertinent Vitals/Pain Pain Assessment: 0-10 Pain Score: 5  Pain Location: L heel  Pain  Descriptors / Indicators: Aching Pain Intervention(s): Limited activity within patient's tolerance;Monitored during session;Repositioned;RN gave pain meds during session    Home Living Family/patient expects to be discharged to:: Skilled nursing facility               Additional Comments: Pt is a long term resident at [name]    Prior Function Level of Independence: Needs assistance      Comments: Used walker and wheel chair per chart review.   PT Goals (current goals can now be found in the care plan section) Acute Rehab PT Goals Patient Stated Goal: to be more independent. PT Goal Formulation: With patient Time For Goal Achievement: 03/14/15 Potential to Achieve Goals: Fair Progress towards PT goals: Progressing toward goals    Frequency  BID    PT Plan Current plan remains appropriate    Co-evaluation             End of Session Equipment Utilized During Treatment: Oxygen;Gait belt Activity Tolerance: Patient tolerated treatment well;Patient limited by lethargy;Treatment limited secondary to medical complications (Comment) Patient left: with call bell/phone within reach;in bed;with bed alarm set     Time: 1610-9604 PT Time Calculation (min) (ACUTE ONLY): 13 min  Charges:  $Therapeutic Exercise: 8-22 mins                    G Codes:      10:20 AM, 03-18-15 James Mcguire, PT, DPT PRN Physical Therapist - [facility] License # [id number] [phone] ([phone])

## 2015-03-03 NOTE — Progress Notes (Signed)
Subjective: 4 Days Post-Op Procedure(s) (LRB): TOTAL HIP ARTHROPLASTY ANTERIOR APPROACH (Left) Patient reports pain as mild.   Patient is well but recently received 2 units of PRBC PT recommending a skilled nursing facility Negative for chest pain and shortness of breath Fever: None with normal white count Gastrointestinal:Negative for nausea and vomiting Pt using nebulizer this morning during exam.  Objective: Vital signs in last 24 hours: Temp:  [97.8 F (36.6 C)-98.9 F (37.2 C)] 98.7 F (37.1 C) (02/19 0325) Pulse Rate:  [70-89] 79 (02/19 0325) Resp:  [16-20] 20 (02/19 0325) BP: (104-150)/(53-64) 110/56 mmHg (02/19 0325) SpO2:  [87 %-98 %] 92 % (02/19 0325) Weight:  [62.234 kg (137 lb 3.2 oz)] 62.234 kg (137 lb 3.2 oz) (02/19 0500)  Intake/Output from previous day:  Intake/Output Summary (Last 24 hours) at 03/03/15 0619 Last data filed at 03/03/15 0500  Gross per 24 hour  Intake      0 ml  Output   1150 ml  Net  -1150 ml    Intake/Output this shift: Total I/O In: -  Out: 450 [Urine:450]  Labs:  Recent Labs  02/28/15 1301 03/01/15 0501 03/01/15 1513 03/02/15 0353 03/03/15 0439  HGB 7.7* 10.9* 10.3* 9.8* 9.3*    Recent Labs  03/02/15 0353 03/03/15 0439  WBC 12.1* 8.9  RBC 3.26* 3.01*  HCT 29.2* 27.4*  PLT 240 258    Recent Labs  03/01/15 0501 03/02/15 0353 03/03/15 0439  NA 140 140  --   K 3.6 3.3* 3.1*  CL 111 110  --   CO2 23 24  --   BUN 18 21*  --   CREATININE 0.87 0.95  --   GLUCOSE 102* 112*  --   CALCIUM 8.1* 8.1*  --    No results for input(s): LABPT, INR in the last 72 hours.   EXAM General - Patient is Alert and Appropriate Extremity - ABD soft Neurovascular intact Sensation intact distally Intact pulses distally Dorsiflexion/Plantar flexion intact Incision: moderate drainage Dressing/Incision -  Honeycomb dressing intact. Motor Function - intact, moving foot and toes well on exam. The patient ambulated bed to  chair.  Past Medical History  Diagnosis Date  . Dementia   . Dysphagia   . Insomnia   . Failure to thrive in adult   . Emphysema of lung (HCC)   . Major depressive disorder (HCC)   . Dementia     Assessment/Plan: 4 Days Post-Op Procedure(s) (LRB): TOTAL HIP ARTHROPLASTY ANTERIOR APPROACH (Left) Active Problems:   Closed left hip fracture (HCC)   Protein-calorie malnutrition, severe  Estimated body mass index is 22.16 kg/(m^2) as calculated from the following:   Height as of this encounter:  (1.676 m).   Weight as of this encounter: 62.234 kg (137 lb 3.2 oz). Advance diet Up with therapy   Pt received 2 units of PRBC 3 days ago.  Hg up to 9.3 this AM.. Pt reports that he has had a BM. Upon discharge, continue Lovenox  q 24 hours x 14 days for DVT prophylaxis.  Dressing changes as needed.  DVT Prophylaxis - Lovenox, Foot Pumps and TED hose Weight-Bearing as tolerated to left leg  Dedra Skeens, PA-C [facility] 03/03/2015, 6:19 AM

## 2015-03-03 NOTE — Discharge Summary (Addendum)
[facility] [place] at [facility]   PATIENT NAME: James Mcguire    MR#:  [id number]  DATE OF BIRTH:  [birth year]  DATE OF ADMISSION:  02/27/2015 ADMITTING PHYSICIAN: Arnaldo Natal, MD  DATE OF DISCHARGE: 03/03/2015  PRIMARY CARE PHYSICIAN: Keane Police, MD     ADMISSION DIAGNOSIS:  Compression fracture [T14.8] Closed wedge compression fracture of first lumbar vertebra, initial encounter (HCC) [S32.010A] Closed left hip fracture, initial encounter (HCC) [S72.002A]  DISCHARGE DIAGNOSIS:  Active Problems:   Closed left hip fracture (HCC)   Protein-calorie malnutrition, severe   Dehydration   Hypotension   Failure to thrive in adult   Acute posthemorrhagic anemia   History of blood transfusion   UTI (urinary tract infection)   SECONDARY DIAGNOSIS:   Past Medical History  Diagnosis Date  . Dementia   . Dysphagia   . Insomnia   . Failure to thrive in adult   . Emphysema of lung (HCC)   . Major depressive disorder (HCC)   . Dementia     .pro HOSPITAL COURSE:   The patient is 82 year old Caucasian male with past medical history significant for history of dementia, dysphagia, failure to thrive in adult, emphysema of lung, major depressive disorder who presents to the hospital after fall. He denies losing consciousness or hitting his head and was complaining of left lower extremity pain. An x-ray done in the emergency room revealed acute fracture of left femoral neck with varus angulation, CT of the head was unremarkable except of involutional changes, lumbar spine x-ray revealed compression fracture of L1 of indeterminate age. Consultation with orthopedic surgeon was obtained and surgery was recommended. The patient underwent left total hip arthroplasty under anterior approach on 02/28/2015. Postoperatively did well, ambulated with physical therapist and was recommended to be discharged to rehabilitation facility. While in the hospital  he was seen by speech therapist and recommended dysphagia 1 diet with honey thick liquids. Patient was about to be discharged to skilled nursing facility on 03/01/2015, however, he was noted to have high fevers . Blood cultures taken on February 17 showed no growth . Urinalysis was remarkable for pyuria, concerning for urinary tract infection, urinary cultures are pending . Patient was initiated on the Rocephin and his fever subsided , as well as his white blood cell count. Chest x-ray done on 17th of February 2017 showed just slight scarring in the bases, but otherwise no significant changes. Overall, patient was asymptomatic . It was recommended to continue antibiotic therapy for a few more days to complete course for urinary tract infection .  Discussion by problem  1. Left hip fracture, status post left hip arthroplasty via anterior approach 16th of February 2017:  Hip surgery POD4. Patient received Pain control, DVT prophylaxis while in the hospital. He was seen by physical therapist recommended rehabilitation placement.  Discharge to skilled nursing facility where he resides today. Since patient had a Foley catheter placed perioperatively and Foley was just removed on February 19 th, it is recommended to follow his urinary output, and have bladder catheterizations done every 8 hours as needed.   2. Dehydration. Improved with IVF.Marland Kitchen Follow patient's oral intake closely since he is prone to dehydration. Of note, patient's kidney function always was good. Since patient had a Foley catheter placed perioperatively and Foley was just removed on February 19 th, it is recommended to follow his urinary output, and have bladder catheterizations done every 8 hours as needed.    3. Anemia of chronic  disease plus acute blood loss perioperatively.  S/p PRBC transfusion 2 units, Hb 9.3  February 18, following closely, initiate iron supplementation orally.  4. COPD with emphasema. Stable. NEB prn. Weaned off  oxygen and O2 sats are 92% on room air, following closely  5. Depression: Continue Remeron (may be a component of dementia as well)  6. Dementia. Aspiration and fall precaution. Patient was evaluated by speech therapist and recommended dysphagia diet with thickened liquids.   7. Hypotension. Given NS bolus x 2, resolved  8. Failure to thrive, adult, patient's oral intake is around 25%, continue Remeron, may benefit from palliative care intervention as outpatient.   9 Fever, likely urinary tract infection, although urinary cultures are pending, patient's chest x-ray revealed scarring in left base, but otherwise no abnormality, continue Keflex for 3 days to complete course for urinary tract infection . Patient as well as her counts normalized with Rocephin   DISCHARGE CONDITIONS:   Stable   CONSULTS OBTAINED:  Treatment Team:  Kennedy Bucker, MD  DRUG ALLERGIES:  No Known Allergies  DISCHARGE MEDICATIONS:   Current Discharge Medication List    START taking these medications   Details  cephALEXin (KEFLEX) 500 MG capsule Take 1 capsule (500 mg total) by mouth 2 (two) times daily. Qty: 6 capsule, Refills: 0    ferrous fumarate-b12-vitamic C-folic acid (TRINSICON / FOLTRIN) capsule Take 1 capsule by mouth 3 (three) times daily after meals. Qty: 30 capsule, Refills: 5    oxyCODONE (OXY IR/ROXICODONE) 5 MG immediate release tablet Take 1-2 tablets (5-10 mg total) by mouth every 6 (six) hours as needed for severe pain or breakthrough pain. Qty: 30 tablet, Refills: 0      CONTINUE these medications which have CHANGED   Details  HYDROcodone-acetaminophen (NORCO/VICODIN) 5-325 MG tablet Take 1 tablet by mouth every 4 (four) hours as needed for moderate pain. Qty: 16 tablet, Refills: 0    traZODone (DESYREL) 50 MG tablet Take 1 tablet (50 mg total) by mouth at bedtime as needed for sleep. Reported on 02/27/2015 Qty: 30 tablet, Refills: 0    Vitamins A & D (VITAMIN A & D) ointment Apply  1 application topically as needed (moisture barrier). Apply with Calmoseptine Qty: 45 g, Refills: 0      CONTINUE these medications which have NOT CHANGED   Details  acetaminophen (TYLENOL) 500 MG tablet Take 500 mg by mouth every 4 (four) hours as needed for moderate pain or fever.    !! clonazepam (KLONOPIN) 0.125 MG disintegrating tablet Take 0.125 mg by mouth daily.     !! clonazepam (KLONOPIN) 0.125 MG disintegrating tablet Take 0.125 mg by mouth daily as needed (anxiety).    Ipratropium-Albuterol (COMBIVENT RESPIMAT) 20-100 MCG/ACT AERS respimat Inhale 1 puff into the lungs 3 (three) times daily.    ipratropium-albuterol (DUONEB) 0.5-2.5 (3) MG/3ML SOLN Take 3 mLs by nebulization every 6 (six) hours as needed (Shortness of Breath/ Wheezing).     mirtazapine (REMERON) 15 MG tablet Take 15 mg by mouth at bedtime.     polyethylene glycol (MIRALAX / GLYCOLAX) packet Take 17 g by mouth daily.    senna-docusate (SENOKOT-S) 8.6-50 MG per tablet Take 2 tablets by mouth 2 (two) times daily.    tiotropium (SPIRIVA) 18 MCG inhalation capsule Place 18 mcg into inhaler and inhale daily.    Vitamin D, Ergocalciferol, (DRISDOL) 50000 units CAPS capsule Take 50,000 Units by mouth every 30 (thirty) days.     !! - Potential duplicate medications found. Please  discuss with provider.    STOP taking these medications     Menthol-Zinc Oxide (CALMOSEPTINE) 0.44-20.6 % OINT          DISCHARGE INSTRUCTIONS:    Patient is to follow-up with primary care physician, orthopedist surgeon as outpatient   If you experience worsening of your admission symptoms, develop shortness of breath, life threatening emergency, suicidal or homicidal thoughts you must seek medical attention immediately by calling 911 or calling your MD immediately  if symptoms less severe.  You Must read complete instructions/literature along with all the possible adverse reactions/side effects for all the Medicines you take and  that have been prescribed to you. Take any new Medicines after you have completely understood and accept all the possible adverse reactions/side effects.   Please note  You were cared for by a hospitalist during your hospital stay. If you have any questions about your discharge medications or the care you received while you were in the hospital after you are discharged, you can call the unit and asked to speak with the hospitalist on call if the hospitalist that took care of you is not available. Once you are discharged, your primary care physician will handle any further medical issues. Please note that NO REFILLS for any discharge medications will be authorized once you are discharged, as it is imperative that you return to your primary care physician (or establish a relationship with a primary care physician if you do not have one) for your aftercare needs so that they can reassess your need for medications and monitor your lab values.    Today   CHIEF COMPLAINT:   Chief Complaint  Patient presents with  . Fall    HISTORY OF PRESENT ILLNESS:  Della Homan  is a 82 y.o. male with a known history of dementia, dysphagia, failure to thrive in adult, emphysema of lung, major depressive disorder who presents to the hospital after fall. He denies losing consciousness or hitting his head and was complaining of left lower extremity pain. An x-ray done in the emergency room revealed acute fracture of left femoral neck with varus angulation, CT of the head was unremarkable except of involutional changes, lumbar spine x-ray revealed compression fracture of L1 of indeterminate age. Consultation with orthopedic surgeon was obtained and surgery was recommended. The patient underwent left total hip arthroplasty under anterior approach on 02/28/2015. Postoperatively did well, ambulated with physical therapist and was recommended to be discharged to rehabilitation facility. While in the hospital he was seen  by speech therapist and recommended dysphagia 1 diet with honey thick liquids. Patient was about to be discharged to skilled nursing facility on 03/01/2015, however, he was noted to have high fevers . Blood cultures taken on February 17 showed no growth . Urinalysis was remarkable for pyuria, concerning for urinary tract infection, urinary cultures are pending . Patient was initiated on the Rocephin and his fever subsided , as well as his white blood cell count. Chest x-ray done on 17th of February 2017 showed just slight scarring in the bases, but otherwise no significant changes. Overall, patient was asymptomatic . It was recommended to continue antibiotic therapy for a few more days to complete course for urinary tract infection .  Discussion by problem  1. Left hip fracture, status post left hip arthroplasty via anterior approach 16th of February 2017:  Hip surgery POD4. Patient received Pain control, DVT prophylaxis while in the hospital. He was seen by physical therapist recommended rehabilitation placement.  Discharge to  skilled nursing facility where he resides today.   2. Dehydration. Improved with IVF.Marland Kitchen Follow patient's oral intake closely since he is prone to dehydration. Of note, patient's kidney function always was good. Since patient had a Foley catheter placed perioperatively and Foley was just removed on February 19 th, it is recommended to follow his urinary output, and have bladder catheterizations done every 8 hours as needed.   3. Anemia of chronic disease plus acute blood loss perioperatively.  S/p PRBC transfusion 2 units, Hb 9.3  February 18, following closely, initiate iron supplementation orally.  4. COPD with emphasema. Stable. NEB prn. Weaned off oxygen and O2 sats are 92% on room air, following closely  5. Depression: Continue Remeron (may be a component of dementia as well)  6. Dementia. Aspiration and fall precaution. Patient was evaluated by speech therapist and  recommended dysphagia diet with thickened liquids.   7. Hypotension. Given NS bolus x 2, resolved  8. Failure to thrive, adult, patient's oral intake is around 25%, continue Remeron, may benefit from palliative care intervention as outpatient.   9 Fever, likely urinary tract infection, although urinary cultures are pending, patient's chest x-ray revealed scarring in left base, but otherwise no abnormality, continue Keflex for 3 days to complete course for urinary tract infection . Patient as well as her counts normalized with Rocephin      VITAL SIGNS:  Blood pressure 134/58, pulse 73, temperature 98.2 F (36.8 C), temperature source Oral, resp. rate 16, height  (1.676 m), weight 62.234 kg (137 lb 3.2 oz), SpO2 92 %.  I/O:   Intake/Output Summary (Last 24 hours) at 03/03/15 0922 Last data filed at 03/03/15 0806  Gross per 24 hour  Intake 684.17 ml  Output   1050 ml  Net -365.83 ml    PHYSICAL EXAMINATION:  GENERAL:  82 y.o.-year-old patient lying in the bed with no acute distress.  EYES: Pupils equal, round, reactive to light and accommodation. No scleral icterus. Extraocular muscles intact.  HEENT: Head atraumatic, normocephalic. Oropharynx and nasopharynx clear.  NECK:  Supple, no jugular venous distention. No thyroid enlargement, no tenderness.  LUNGS: Normal breath sounds bilaterally, no wheezing, rales,rhonchi or crepitation. No use of accessory muscles of respiration.  CARDIOVASCULAR: S1, S2 normal. No murmurs, rubs, or gallops.  ABDOMEN: Soft, non-tender, non-distended. Bowel sounds present. No organomegaly or mass.  EXTREMITIES: No pedal edema, cyanosis, or clubbing.  incision site in left anterior thigh area has no significant swelling, bleeding, discharge. Minimal discomfort on palpation was noted .  NEUROLOGIC: Cranial nerves II through XII are intact. Muscle strength 5/5 in all extremities. Sensation intact. Gait not checked.  PSYCHIATRIC: The patient is alert and  oriented x 3.  SKIN: No obvious rash, lesion, or ulcer.   DATA REVIEW:   CBC  Recent Labs Lab 03/03/15 0439  WBC 8.9  HGB 9.3*  HCT 27.4*  PLT 258    Chemistries   Recent Labs Lab 02/27/15 0335  03/02/15 0353 03/03/15 0439  NA 135  < > 140  --   K 3.8  < > 3.3* 3.1*  CL 104  < > 110  --   CO2 23  < > 24  --   GLUCOSE 110*  < > 112*  --   BUN 34*  < > 21*  --   CREATININE 1.09  < > 0.95  --   CALCIUM 8.7*  < > 8.1*  --   AST 25  --   --   --  ALT 17  --   --   --   ALKPHOS 60  --   --   --   BILITOT 0.7  --   --   --   < > = values in this interval not displayed.  Cardiac Enzymes  Recent Labs Lab 02/27/15 0335  TROPONINI 0.04*    Microbiology Results  Results for orders placed or performed during the hospital encounter of 02/27/15  MRSA PCR Screening     Status: None   Collection Time: 02/27/15  7:49 AM  Result Value Ref Range Status   MRSA by PCR NEGATIVE NEGATIVE Final    Comment:        The GeneXpert MRSA Assay (FDA approved for NASAL specimens only), is one component of a comprehensive MRSA colonization surveillance program. It is not intended to diagnose MRSA infection nor to guide or monitor treatment for MRSA infections.   CULTURE, BLOOD (ROUTINE X 2) w Reflex to PCR ID Panel     Status: None (Preliminary result)   Collection Time: 03/01/15  2:58 PM  Result Value Ref Range Status   Specimen Description BLOOD  RT HAND  Final   Special Requests   Final    BOTTLES DRAWN AEROBIC AND ANAEROBIC AER ANA    Culture NO GROWTH < 24 HOURS  Final   Report Status PENDING  Incomplete  CULTURE, BLOOD (ROUTINE X 2) w Reflex to PCR ID Panel     Status: None (Preliminary result)   Collection Time: 03/01/15  3:07 PM  Result Value Ref Range Status   Specimen Description BLOOD LT HAND  Final   Special Requests   Final    BOTTLES DRAWN AEROBIC AND ANAEROBIC ANA AER   Culture NO GROWTH < 24 HOURS  Final   Report Status PENDING  Incomplete     RADIOLOGY:  Dg Chest 1 View  03/01/2015  CLINICAL DATA:  Fever and tachycardia EXAM: CHEST 1 VIEW COMPARISON:  February 27, 2015 FINDINGS: The patient's hand overlaps the lateral left hemithorax. There is mild scarring in the left base. There is no edema or consolidation. Heart is upper normal in size with pulmonary vascularity within normal limits. No adenopathy. There is evidence of old rib trauma with remodeling bilaterally. Bones are osteoporotic. IMPRESSION: Slight scarring left base. No edema or consolidation. No change in cardiac silhouette. Electronically Signed   By: Bretta Bang III M.D.   On: 03/01/2015 15:22    EKG:   Orders placed or performed during the hospital encounter of 10/05/14  . EKG 12-Lead  . EKG 12-Lead  . EKG      Management plans discussed with the patient, family and they are in agreement.  CODE STATUS:     Code Status Orders        Start     Ordered   02/27/15 0817  Do not attempt resuscitation (DNR)   Continuous    Question Answer Comment  In the event of cardiac or respiratory ARREST Do not call a "code blue"   In the event of cardiac or respiratory ARREST Do not perform Intubation, CPR, defibrillation or ACLS   In the event of cardiac or respiratory ARREST Use medication by any route, position, wound care, and other measures to relive pain and suffering. May use oxygen, suction and manual treatment of airway obstruction as needed for comfort.      02/27/15 0817    Code Status History    Date Active  Date Inactive Code Status Order ID Comments User Context   02/27/2015  7:42 AM 02/27/2015  8:17 AM Full Code [id number]  Arnaldo Natal, MD Inpatient   02/27/2015  4:57 AM 02/27/2015  7:42 AM Full Code [id number]  Kennedy Bucker, MD ED    Advance Directive Documentation        Most Recent Value   Type of Advance Directive  Out of facility DNR (pink MOST or yellow form)   Pre-existing out of facility DNR order (yellow form or pink MOST form)   Yellow form placed in chart (order not valid for inpatient use)   "MOST" Form in Place?        TOTAL TIME TAKING CARE OF THIS PATIENT: 40 minutes.    Katharina Caper M.D on 03/03/2015 at 9:22 AM  Between 7am to 6pm - Pager - [phone]  After 6pm go to [email or web address] - password [email or web address] [facility]  [facility]  Office  [phone]  CC: Primary care physician; Keane Police, MD

## 2015-03-04 LAB — URINE CULTURE
Culture: NO GROWTH
Special Requests: NORMAL

## 2015-03-06 LAB — CULTURE, BLOOD (ROUTINE X 2): Culture: NO GROWTH

## 2015-03-07 LAB — CULTURE, BLOOD (ROUTINE X 2)

## 2015-03-14 ENCOUNTER — Other Ambulatory Visit: Payer: Self-pay | Admitting: Family Medicine

## 2015-03-14 DIAGNOSIS — R1310 Dysphagia, unspecified: Secondary | ICD-10-CM

## 2015-03-19 ENCOUNTER — Other Ambulatory Visit
Admission: RE | Admit: 2015-03-19 | Discharge: 2015-03-19 | Disposition: A | Discharge status: Home / Self Care | Payer: Medicare Other | Source: Skilled Nursing Facility | Attending: Family Medicine | Admitting: Family Medicine

## 2015-03-19 DIAGNOSIS — R4182 Altered mental status, unspecified: Secondary | ICD-10-CM | POA: Diagnosis present

## 2015-03-19 LAB — CBC WITH DIFFERENTIAL/PLATELET
BASOS PCT: 1 %
Basophils Absolute: 0.1 10*3/uL (ref 0–0.1)
Eosinophils Absolute: 0.3 10*3/uL (ref 0–0.7)
Eosinophils Relative: 3 %
HEMATOCRIT: 31.8 % — AB (ref 40.0–52.0)
Hemoglobin: 10.6 g/dL — ABNORMAL LOW (ref 13.0–18.0)
LYMPHS ABS: 1.3 10*3/uL (ref 1.0–3.6)
Lymphocytes Relative: 11 %
MCH: 29.9 pg (ref 26.0–34.0)
MCHC: 33.4 g/dL (ref 32.0–36.0)
MCV: 89.7 fL (ref 80.0–100.0)
MONO ABS: 1.1 10*3/uL — AB (ref 0.2–1.0)
MONOS PCT: 10 %
NEUTROS ABS: 8.9 10*3/uL — AB (ref 1.4–6.5)
Neutrophils Relative %: 75 %
Platelets: 569 10*3/uL — ABNORMAL HIGH (ref 150–440)
RBC: 3.54 MIL/uL — ABNORMAL LOW (ref 4.40–5.90)
RDW: 16 % — AB (ref 11.5–14.5)
WBC: 11.7 10*3/uL — ABNORMAL HIGH (ref 3.8–10.6)

## 2015-03-19 LAB — URINALYSIS COMPLETE WITH MICROSCOPIC (ARMC ONLY)
BILIRUBIN URINE: NEGATIVE
Bacteria, UA: NONE SEEN
Glucose, UA: NEGATIVE mg/dL
Hgb urine dipstick: NEGATIVE
KETONES UR: NEGATIVE mg/dL
NITRITE: NEGATIVE
Protein, ur: NEGATIVE mg/dL
SPECIFIC GRAVITY, URINE: 1.027 (ref 1.005–1.030)
Squamous Epithelial / LPF: NONE SEEN
pH: 5 (ref 5.0–8.0)

## 2015-03-19 LAB — BASIC METABOLIC PANEL
Anion gap: 8 (ref 5–15)
BUN: 33 mg/dL — AB (ref 6–20)
CO2: 24 mmol/L (ref 22–32)
CREATININE: 1 mg/dL (ref 0.61–1.24)
Calcium: 8.6 mg/dL — ABNORMAL LOW (ref 8.9–10.3)
Chloride: 106 mmol/L (ref 101–111)
Glucose, Bld: 93 mg/dL (ref 65–99)
Potassium: 4.1 mmol/L (ref 3.5–5.1)
Sodium: 138 mmol/L (ref 135–145)

## 2015-03-21 LAB — URINE CULTURE: Culture: NO GROWTH

## 2015-03-27 ENCOUNTER — Ambulatory Visit
Admission: RE | Admit: 2015-03-27 | Discharge: 2015-03-27 | Disposition: A | Discharge status: Home / Self Care | Payer: Medicare Other | Source: Ambulatory Visit | Attending: Family Medicine | Admitting: Family Medicine

## 2015-03-27 DIAGNOSIS — R1310 Dysphagia, unspecified: Secondary | ICD-10-CM | POA: Insufficient documentation

## 2015-03-27 DIAGNOSIS — R1312 Dysphagia, oropharyngeal phase: Secondary | ICD-10-CM

## 2015-03-27 LAB — URINALYSIS COMPLETE WITH MICROSCOPIC (ARMC ONLY)
BILIRUBIN URINE: NEGATIVE
Bacteria, UA: NONE SEEN
Glucose, UA: NEGATIVE mg/dL
Ketones, ur: NEGATIVE mg/dL
LEUKOCYTES UA: NEGATIVE
Nitrite: NEGATIVE
PH: 6 (ref 5.0–8.0)
Protein, ur: NEGATIVE mg/dL
SPECIFIC GRAVITY, URINE: 1.017 (ref 1.005–1.030)

## 2015-03-28 NOTE — Therapy (Signed)
[facility] DIAGNOSTIC RADIOLOGY [address] [place], [place], [address] Phone: [phone]   Fax:     Modified Barium Swallow  Patient Details  Name: James Mcguire MRN: [id number] Date of Birth: July 23, 1932 No Data Recorded  Encounter Date: 03/27/2015      End of Session - 03/28/15 0809    Visit Number 1   Number of Visits 1   Date for SLP Re-Evaluation 03/27/15   SLP Start Time 1215   SLP Stop Time  1310   SLP Time Calculation (min) 55 min   Activity Tolerance Patient tolerated treatment well      Past Medical History  Diagnosis Date  . Dementia   . Dysphagia   . Insomnia   . Failure to thrive in adult   . Emphysema of lung (HCC)   . Major depressive disorder (HCC)   . Dementia     Past Surgical History  Procedure Laterality Date  . None    . Total hip arthroplasty Left 02/27/2015    Procedure: TOTAL HIP ARTHROPLASTY ANTERIOR APPROACH;  Surgeon: Kennedy Bucker, MD;  Location: [facility] ORS;  Service: Orthopedics;  Laterality: Left;    There were no vitals filed for this visit.  Visit Diagnosis: Oropharyngeal dysphagia  Dysphagia - Plan: DG OP Swallowing Func-Medicare/Speech Path, DG OP Swallowing Func-Medicare/Speech Path   Subjective: Patient behavior: (alertness, ability to follow instructions, etc.): Confused but cooperative  Chief complaint: recent hospitalization, discharged on Dysphagia I with honey-thick liquid   Objective:  Radiological Procedure: A videoflouroscopic evaluation of oral-preparatory, reflex initiation, and pharyngeal phases of the swallow was performed; as well as a screening of the upper esophageal phase.  I. POSTURE: Upright in MBS chair  II. VIEW: Lateral  III. COMPENSATORY STRATEGIES: N/A  IV. BOLUSES ADMINISTERED:   Thin Liquid: Not assessed due to aspiration of nectar-thick liquid   Nectar-thick Liquid: 2 teaspoon presentation, 1 large self-administered cup rim sip   Honey-thick  Liquid: 2 teaspoon presentation   Puree: 2 teaspoon presentation   Mechanical Soft: 1/4 graham cracker in applesauce  V. RESULTS OF EVALUATION: A. ORAL PREPARATORY PHASE: (The lips, tongue, and velum are observed for strength and coordination)       **Overall Severity Rating: Mild; slow, disorganized oral management; able to masticate a cracker in moist substrate  B. SWALLOW INITIATION/REFLEX: (The reflex is normal if "triggered" by the time the bolus reached the base of the tongue)  **Overall Severity Rating: Mild; triggers while falling from the valleculae to the pyriform sinuses  C. PHARYNGEAL PHASE: (Pharyngeal function is normal if the bolus shows rapid, smooth, and continuous transit through the pharynx and there is no pharyngeal residue after the swallow)  **Overall Severity Rating: Moderate-severe; decreased tongue base retraction and absent epiglottic inversion. Moderate vallecular residue with small (teaspoon) boluses all consistencies.  Moderate-severe vallecular residue and moderate pyriform sinus residue with large nectar-thick liquid bolus  D. LARYNGEAL PENETRATION: (Material entering into the laryngeal inlet/vestibule but not aspirated): observed with initial swallow of large bolus of nectar-thick liquid   E. ASPIRATION: Silent aspiration from pyriform sinus residue during second swallow of the large nectar-thick bolus  F. ESOPHAGEAL PHASE: (Screening of the upper esophagus): no observed abnormalities within the viewable esophagus  ASSESSMENT: This 82 year old man; with recent hospitalization for hip fracture and past medical history including dementia, dysphagia failure to thrive, emphysema, and major depressive disorder; is presenting with moderate oropharyngeal dysphagia characterized by slow and disorganized oral management, delayed pharyngeal swallow  initiation, and decreased tongue base retraction and incomplete epiglottic inversion (with m oderate vallecular residue and  trace residue along the posterior pharyngeal wall pyriform sinuses with small boluses and moderate- severe vallecular residue and moderate residue in the pyriform sinuses with moderate self-administered nectar-thick liquid boluses).  There is no observed laryngeal penetration or aspiration of solid, honey-thick liquid, or teaspoon presentation of nectar-thick liquid.  With self-administered nectar-thick liquid there is aspiration of pyriform sinus residue without cough. This study supports a Dysphagia III diet with honey-thick liquid.  Unfortunately a cough cannot serve as an indicator of the presence or absence of aspiration as the witnessed aspiration was silent. If the patient is able to actively participate, he may benefit from swallowing exercises to improve pharyngeal clearance such as the Shaker exercise.  PLAN/RECOMMENDATIONS:   A. Diet: Dysphagia III with honey-thick liquid (nectar-thick if bolus size can be controlled)   B. Swallowing Precautions: per treating speech therapist   C. Recommended consultation to N/A   D. Therapy recommendations: swallowing exercises to improve pharyngeal clearance such as the Shaker exercise   E. Results and recommendations were routed to referring MD and faxed to treating SLP         G-Codes - 03/28/15 0810    Functional Assessment Tool Used MBS, clinical judgment   Functional Limitations Swallowing   Swallow Current Status ([id number]) At least 60 percent but less than 80 percent impaired, limited or restricted   Swallow Goal Status ([id number]([id number]) At least 60 percent but less than 80 percent impaired, limited or restricted   Swallow Discharge Status [phone]([id number]) At least 60 percent but less than 80 percent impaired, limited or restricted          Problem List Patient Active Problem List   Diagnosis Date Noted  . Dehydration 03/03/2015  . Hypotension 03/03/2015  . Failure to thrive in adult 03/03/2015  . Acute posthemorrhagic anemia 03/03/2015  .  History of blood transfusion 03/03/2015  . UTI (urinary tract infection) 03/03/2015  . Protein-calorie malnutrition, severe 02/28/2015  . Closed left hip fracture (HCC) 02/27/2015   Dollene PrimroseSusan G Telecia Larocque, MS/CCC- SLP  Leandrew KoyanagiAbernathy, Susie 03/28/2015, 8:12 AM   [facility] DI[facility]NOSTIC RADIOLOGY [address] [place], [place], [address] Phone: [phone]   Fax:     Name: Reynolds BowlWebster Eatherly MRN: [id number] Date of Birth: Oct 27, 1932

## 2015-03-29 LAB — URINE CULTURE: Culture: NO GROWTH

## 2015-12-13 DEATH — deceased

## 2017-06-27 IMAGING — CT CT HEAD W/O CM
2 of 4 series · 16 of 30 positions shown, 19 images · non-contrast
Comparison: CT head October 05, 2014

CLINICAL DATA: Fall with head injury.  History of dementia.

EXAM:
CT HEAD WITHOUT CONTRAST
TECHNIQUE: Contiguous axial images were obtained from the base of the skull
through the vertex without intravenous contrast.

[Series 3: head bone · axial · 0.50mm/px · z∈[-187,-67]mm · 7 of 87 slices shown]
[im 9/87  bone]
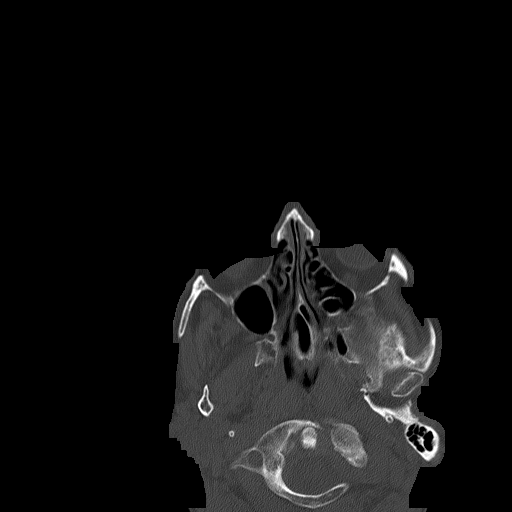
[im 18/87  bone]
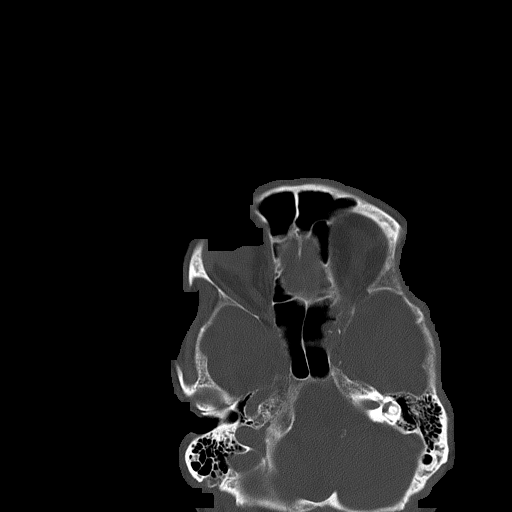
[im 26/87  bone]
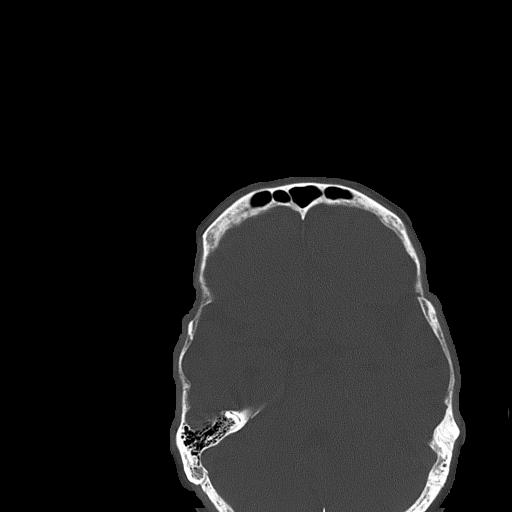
[im 35/87  bone]
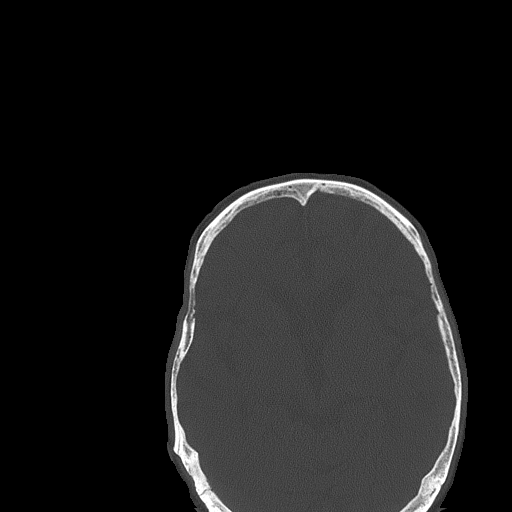
[im 52/87  bone]
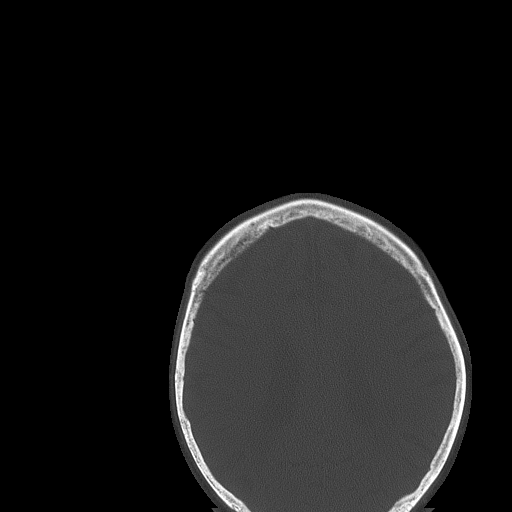
[im 61/87  bone]
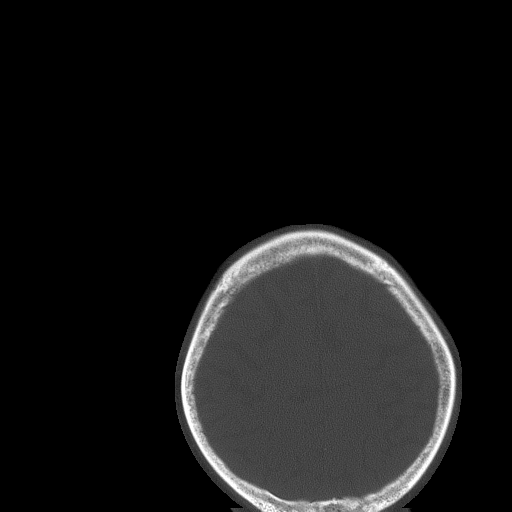
[im 69/87  bone]
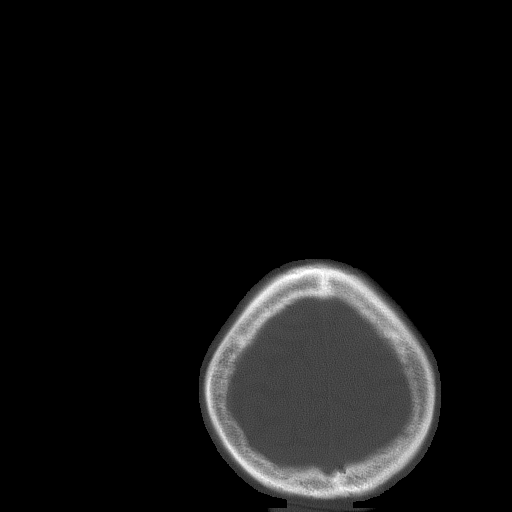

[Series 5: head (id) · axial · 0.50mm/px · z∈[-187,-49]mm · 9 of 87 slices shown, 12 images]
[im 9/87  brain]
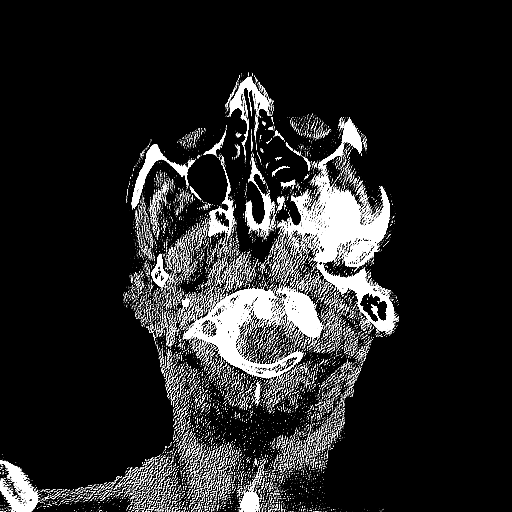
[im 9/87  bone]
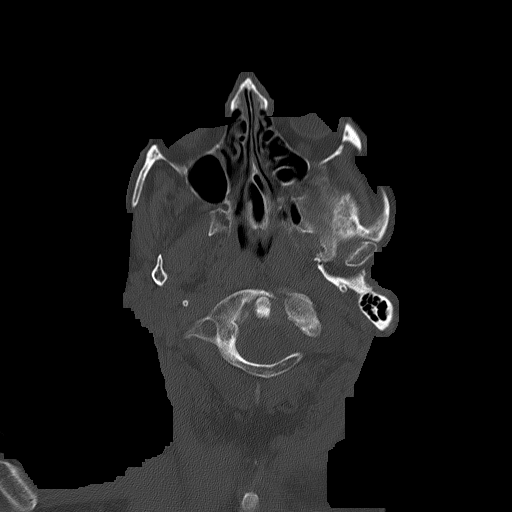
[im 18/87  brain]
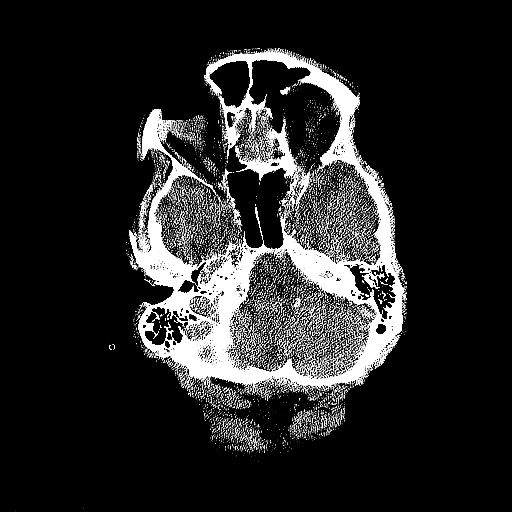
[im 26/87  brain]
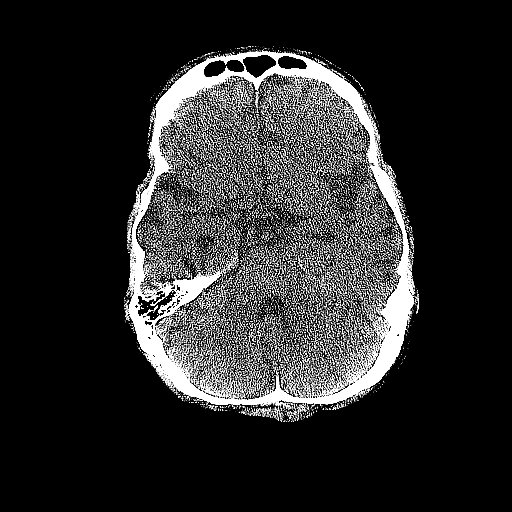
[im 35/87  brain]
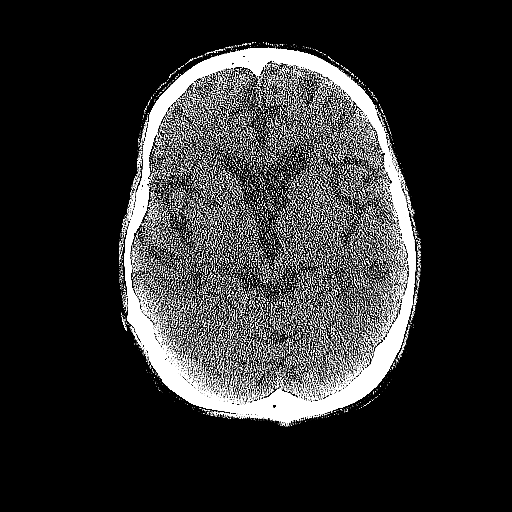
[im 44/87  brain]
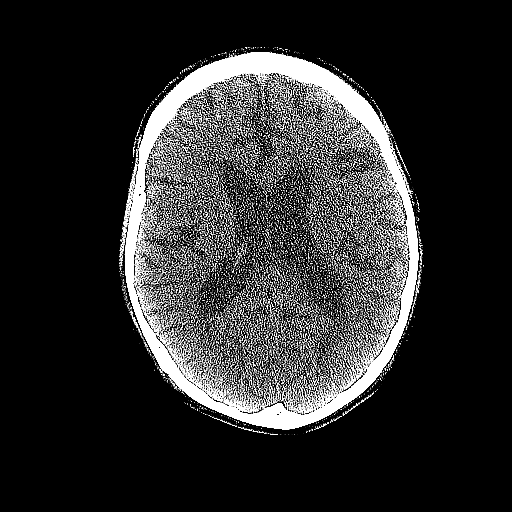
[im 44/87  bone]
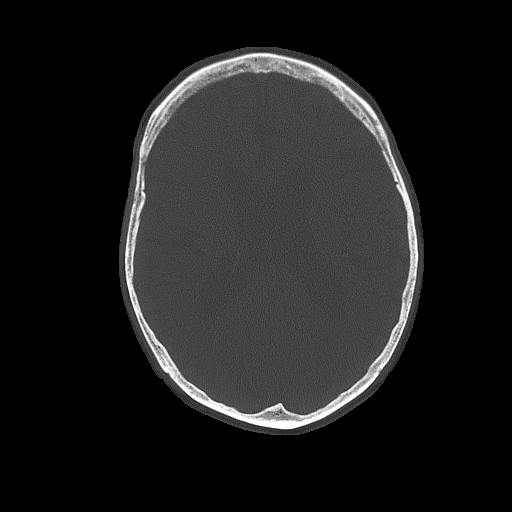
[im 52/87  brain]
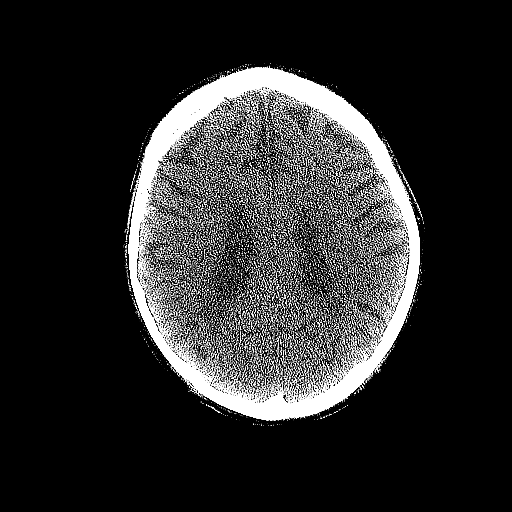
[im 61/87  brain]
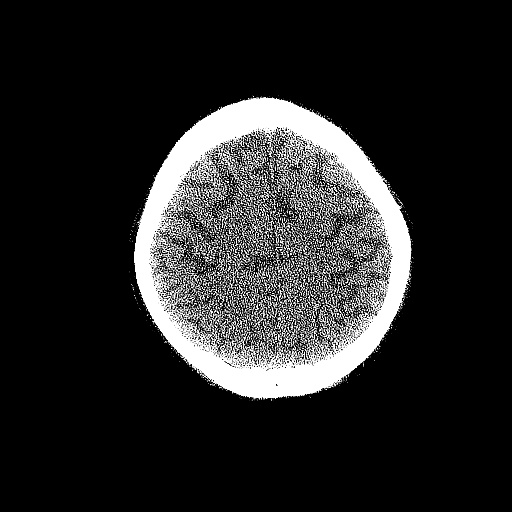
[im 69/87  brain]
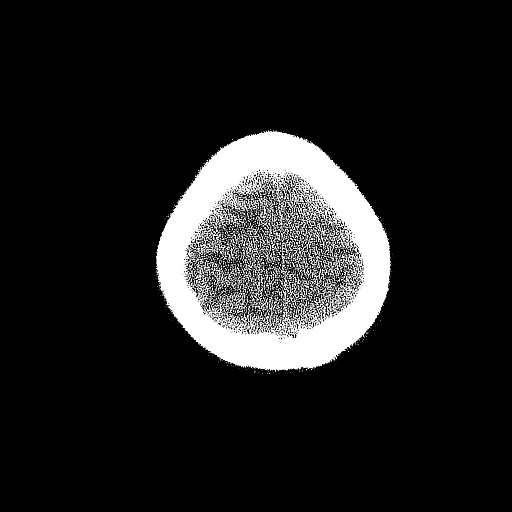
[im 78/87  brain]
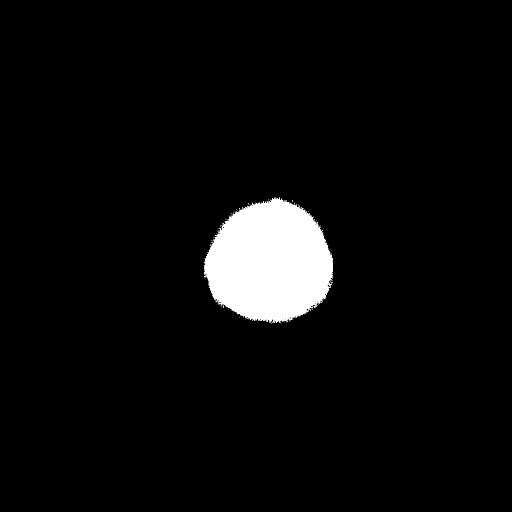
[im 78/87  bone]
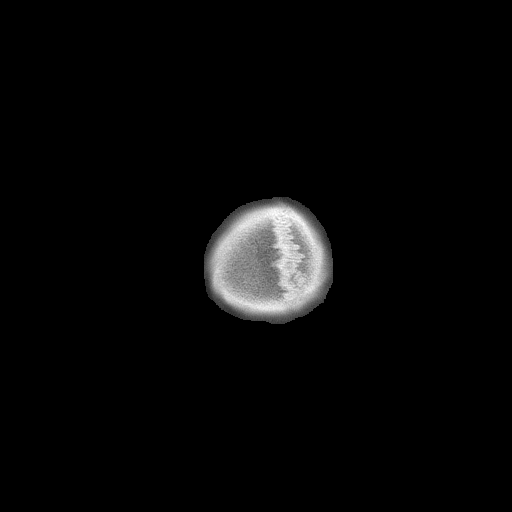

[16 of 30 positions shown; findings below may reference images not displayed]

FINDINGS: The ventricles and sulci are normal for age. No intraparenchymal
hemorrhage, mass effect nor midline shift. Patchy supratentorial
white matter hypodensities are within normal range for patient's age
and though non-specific suggest sequelae of chronic small vessel
ischemic disease. No acute large vascular territory infarcts.

No abnormal extra-axial fluid collections. Basal cisterns are
patent. Moderate calcific atherosclerosis of the carotid siphons.

No skull fracture. The included ocular globes and orbital contents
are non-suspicious. Mild suspected enophthalmos. The mastoid
aircells and included paranasal sinuses are well-aerated. Moderate
RIGHT temporomandibular osteoarthrosis.
IMPRESSION: No acute intracranial process.

Stable involutional changes and moderate chronic small vessel
ischemic disease.
